# Patient Record
Sex: Female | Born: 1980 | Race: Black or African American | Hispanic: No | Marital: Married | Smoking: Former smoker
Health system: Southern US, Community
[De-identification: ages and names within clinical notes are randomized; demographics above are authoritative.]

## PROBLEM LIST (undated history)

## (undated) DIAGNOSIS — D860 Sarcoidosis of lung: Secondary | ICD-10-CM

## (undated) DIAGNOSIS — N200 Calculus of kidney: Secondary | ICD-10-CM

## (undated) DIAGNOSIS — D75A Glucose-6-phosphate dehydrogenase (G6PD) deficiency without anemia: Secondary | ICD-10-CM

## (undated) HISTORY — DX: Sarcoidosis of lung: D86.0

## (undated) HISTORY — DX: Glucose-6-phosphate dehydrogenase (G6PD) deficiency without anemia: D75.A

## (undated) HISTORY — PX: OTHER SURGICAL HISTORY: SHX169

---

## 2009-07-18 ENCOUNTER — Emergency Department (HOSPITAL_COMMUNITY): Admission: EM | Admit: 2009-07-18 | Discharge: 2009-07-18 | Payer: Self-pay | Admitting: Emergency Medicine

## 2009-09-06 ENCOUNTER — Ambulatory Visit: Payer: Self-pay | Admitting: Internal Medicine

## 2009-09-06 DIAGNOSIS — D869 Sarcoidosis, unspecified: Secondary | ICD-10-CM

## 2009-09-06 DIAGNOSIS — D551 Anemia due to other disorders of glutathione metabolism: Secondary | ICD-10-CM | POA: Insufficient documentation

## 2009-09-13 LAB — CONVERTED CEMR LAB
Alkaline Phosphatase: 63 units/L (ref 39–117)
Angiotensin 1 Converting Enzyme: 100 units/L — ABNORMAL HIGH (ref 9–67)
BUN: 18 mg/dL (ref 6–23)
Bilirubin, Direct: 0.4 mg/dL — ABNORMAL HIGH (ref 0.0–0.3)
CO2: 26 meq/L (ref 19–32)
Calcium: 9.9 mg/dL (ref 8.4–10.5)
Chloride: 105 meq/L (ref 96–112)
Glucose, Bld: 81 mg/dL (ref 70–99)
Sodium: 139 meq/L (ref 135–145)
Total Bilirubin: 1.6 mg/dL — ABNORMAL HIGH (ref 0.3–1.2)

## 2009-10-16 ENCOUNTER — Ambulatory Visit: Payer: Self-pay | Admitting: Internal Medicine

## 2009-10-19 ENCOUNTER — Encounter: Payer: Self-pay | Admitting: Internal Medicine

## 2009-10-23 ENCOUNTER — Telehealth (INDEPENDENT_AMBULATORY_CARE_PROVIDER_SITE_OTHER): Payer: Self-pay | Admitting: *Deleted

## 2010-04-17 NOTE — Progress Notes (Signed)
Summary: CXR results  Phone Note Call from Patient Call back at [phone]   Caller: Patient Call For: wert Summary of Call: Returning phone call in ref to letter for test results. Initial call taken by: Darletta Moll,  October 23, 2009 9:54 AM  Follow-up for Phone Call        Patient is aware cxr from 8/1 was normal and no changes to any recs from [facility]. Follow-up by: Michel Bickers CMA,  October 23, 2009 10:58 AM

## 2010-04-17 NOTE — Letter (Signed)
Summary: [facility]  [address]   [place], [place] [address]   Phone: [phone]  Fax: [phone]    10/19/2009  [facility] [address] [place], [place]  [address]  Dear Ms. Robers,    We have tried to reach you by telephone regarding your recent test results, but were unable to speak with you.  Please call our office at [phone] for these results, thank you.       Sincerely,   [facility]

## 2010-04-17 NOTE — Assessment & Plan Note (Signed)
Summary: Pulmonary/ new pt eval for sarcoidosis    Visit Type:  Initial Consult Copy to:  Self Primary Provider/Referring Provider:  none  CC:  Sarcoidosis.  History of Present Illness: 30 yobf quit smoking 10/2008 with dx of sarcoid at [facility] aug 2009 with weakness and wt loss sob and cough and dx made by tbbx and placed on prednisone daily since then with >  80% resolution of symptoms.  September 06, 2009 1st pulm ov c/o aches and pains with doe x steps on prednisone 10mg  and can do one flight without stopping. Now off prednisone x sev months with progressively worsening indolent onset constant  Low Back and thighs  not wrists, not ankles, no rash assoc with mild dry cough and decrease activity tol due to sob.   Never saw eye doctor. Much worse off prednisone to point where needs to 5 advil at bedtime.  Pt denies any significant sore throat, dysphagia, itching, sneezing,  nasal congestion or excess secretions,  fever, chills, sweats, unintended wt loss, pleuritic or exertional cp, hempoptysis, orthopnea pnd or leg swelling   Current Medications (verified): 1)  None  Allergies (verified): 1)  ! 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 Quit in Aug 2010.  Smoked for approx 10 yrs up to 1/2 ppd Moved from [place] to [facility] in April 2011  Review of Systems       The patient complains of shortness of breath with activity, non-productive cough, and joint stiffness or pain.  The patient denies shortness of breath at rest, productive cough, coughing up blood, chest pain, irregular heartbeats, acid heartburn, indigestion, loss of appetite, weight change, abdominal  pain, difficulty swallowing, sore throat, tooth/dental problems, headaches, nasal congestion/difficulty breathing through nose, sneezing, itching, ear ache, anxiety, depression, hand/feet swelling, rash, change in color of mucus, and fever.    Vital Signs:  Patient profile:   30 year old female Height:      64 inches Weight:      170 pounds BMI:     29.29 O2 Sat:      96 % on Room air Temp:     97.6 degrees F oral Pulse rate:   76 / minute BP sitting:   108 / 72  (left arm)  Vitals Entered By: Vernie Murders (September 06, 2009 2:02 PM)  O2 Flow:  Room air  Physical Exam  Additional Exam:  amb pleasant bf nad wt = 170 September 06, 2009  HEENT: nl dentition, turbinates, and orophanx. Nl external ear canals without cough reflex NECK :  without JVD/Nodes/TM/ nl carotid upstrokes bilaterally LUNGS: no acc muscle use, clear to A and P bilaterally without cough on insp or exp maneuvers CV:  RRR  no s3 or murmur or increase in P2, no edema  ABD:  soft and nontender with nl excursion in the supine position. No bruits or organomegaly, bowel sounds nl MS:  warm without deformities, calf tenderness, cyanosis or clubbing SKIN: warm and dry without lesions   NEURO:  alert, approp, no deficits     Sodium  139 mEq/L                   135-145   Potassium                 4.8 mEq/L                   3.5-5.1   Chloride                  105 mEq/L                   96-112   Carbon Dioxide            26 mEq/L                    19-32   Glucose                   81 mg/dL                    29-52   BUN                       18 mg/dL                    8-41   Creatinine                1.1 mg/dL                   3.2-4.4   Calcium                   9.9 mg/dL                   0.1-02.7   GFR                       75.51 mL/min                >60  Tests: (2) Hepatic/Liver Function Panel (HEPATIC)   Total Bilirubin      [H]  1.6 mg/dL                   2.5-3.6   Direct Bilirubin     [H]  0.4 mg/dL                    6.4-4.0   Alkaline Phosphatase      63 U/L                      39-117   AST                       29 U/L                      0-37   ALT                       15 U/L                      0-35   Total Protein        [H]  8.7 g/dL                    3.4-7.4   Albumin  3.9 g/dL                    1.6-1.0  Tests: (3) Sed Rate (ESR)   Sed Rate                  22 mm/hr                    0-22    Impression & Recommendations:  Problem # 1:  PULMONARY SARCOIDOSIS (ICD-135) Labs ok x for increaseed protein to alb ratio typical of active sarcoid with mostly musculoskeletal co's disproportionate to pulmonary symptoms typical of her patter since onset.  No xrays in e chart  The goal with a chronic steroid dependent illness is always arriving at the lowest effective dose that controls the disease/symptoms and not accepting a set "formula" which is based on statistics that don't take into accound individual variability or the natural hx of the dz in every individual patient, which may well vary over time. For now try ceiling of 20 and floor of 10 then regroup in 6 weeks with pfts and cxr then  Medications Added to Medication List This Visit: 1)  Prednisone 10 Mg Tabs (Prednisone) .... Take 2 daily with bfast until back to normal then 1 daily thereafter  Other Orders: T-Angiotensin i-Converting Enzyme (96045-40981) TLB-BMP (Basic Metabolic Panel-BMET) (80048-METABOL) TLB-Hepatic/Liver Function Pnl (80076-HEPATIC) TLB-Sedimentation Rate (ESR) (85652-ESR) New Patient Level V (19147)  Patient Instructions: 1)  Please schedule a follow-up appointment in 6 weeks, sooner if needed with PFT's and cxr on return 2)  Ok to use advil up to 4 with meal as needed for joint pain 3)  Prednisone 10 mg 2 daily until btter then one daily until return 4)  You need an eye evaluation Chales Abrahams) Prescriptions: PREDNISONE 10 MG  TABS (PREDNISONE) Take 2 daily with bfast until back to  normal then 1 daily thereafter  #100 x 0   Entered and Authorized by:   Nyoka Cowden MD   Signed by:   Nyoka Cowden MD on 09/06/2009   Method used:   Electronically to        [facility] [phone]* (retail)       [address] [place], [place]  [address]       Ph: [phone] or [phone]       Fax: [phone]   RxID:   [id number]

## 2010-04-17 NOTE — Miscellaneous (Signed)
Summary: Orders Update pft charges  Clinical Lists Changes  Orders: Added new Service order of Carbon Monoxide diffusing w/capacity (94720) - Signed Added new Service order of Lung Volumes (94240) - Signed Added new Service order of Spirometry (Pre & Post) (94060) - Signed 

## 2010-04-17 NOTE — Assessment & Plan Note (Signed)
Summary: Pulmonary/  f/u ov   Copy to:  Self Primary Provider/Referring Provider:  none  CC:  Followup with PFT's.  Pt states doing well and denies any complaints today.Marland Kitchen  History of Present Illness: 4 yobf quit smoking 10/2008 with dx of sarcoid at [facility] aug 2009 with weakness and wt loss sob and cough and dx made by tbbx and placed on prednisone daily since then with >  80% resolution of symptoms.  September 06, 2009 1st pulm ov c/o aches and pains with doe x steps on prednisone 10mg  and can do one flight without stopping. Now off prednisone x sev months with progressively worsening indolent onset constant  Low Back and thighs  not wrists, not ankles, no rash assoc with mild dry cough and decrease activity tol due to sob.   Never saw eye doctor. Much worse off prednisone to point where needs to 5 advil at bedtime.  rec Ok to use advil up to 4 with meal as needed for joint pain Prednisone 10 mg 2 daily until btter then one daily until return Need an eye evaluation> done.  October 16, 2009 Followup with PFT's.  Pt states doing well and denies any complaints. no more aches or pains.  Pt denies any significant sore throat, dysphagia, itching, sneezing,  nasal congestion or excess secretions,  fever, chills, sweats, unintended wt loss, pleuritic or exertional cp, hempoptysis, change in activity tolerance  orthopnea pnd or leg swelling Pt also denies any obvious fluctuation in symptoms with weather or environmental change or other alleviating or aggravating factors.       Current Medications (verified): 1)  Prednisone 10 Mg  Tabs (Prednisone) .Marland Kitchen.. 1  Every Morning  Allergies (verified): 1)  ! 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)       - Dx August 2009 [facility]       - ACE level September 07, 2009 = 100       - Opth eval rec September 07, 2009 >  [facility]       - PFT's October 16, 2009 VC 63% no airflow obstruction, DLCO  54 > corrects to 109  Vital Signs:  Patient profile:   30 year old female Weight:      178 pounds O2 Sat:      98 % on Room air Temp:     97.5 degrees F oral Pulse rate:   78 / minute BP sitting:   100 / 62  (left arm)  Vitals Entered By: Vernie Murders (October 16, 2009 10:49 AM)  O2 Flow:  Room air  Physical Exam  Additional Exam:  amb pleasant bf nad  wt = 170 September 06, 2009 > 178 October 16, 2009  HEENT: nl dentition, turbinates, and orophanx. Nl external ear canals without cough reflex NECK :  without JVD/Nodes/TM/ nl carotid upstrokes bilaterally LUNGS: no acc muscle use, clear to A and P bilaterally without cough on insp or exp maneuvers CV:  RRR  no s3 or murmur or increase in P2, no edema  ABD:  soft and nontender with nl excursion in the supine position. No bruits or organomegaly, bowel sounds nl MS:  warm without deformities, calf tenderness, cyanosis or clubbing       CXR  Procedure date:  10/16/2009  Findings:        Comparison: None.   Findings: The heart size is normal.  There is bilateral superior hilar retraction  with associated perihilar scarring and probable bronchiectasis.  Prominent left paratracheal density may be related to scarring adjacent to the aortic arch or adenopathy.  No edema or confluent air space opacity is demonstrated.  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   IMPRESSION:   1.  Bilateral hilar distortion and peribronchial scarring consistent with sarcoidosis. 2.  Prominent left paratracheal density could be related to parenchymal scarring or adenopathy.  Correlation with prior studies is recommended.  In the absence of prior studies, short-term radiographic follow-up or CT would be suggested.  Impression & Recommendations:  Problem # 1:  PULMONARY SARCOIDOSIS (ICD-135) The goal with a chronic steroid dependent illness is always arriving at the lowest effective dose that controls the disease/symptoms and not accepting a set "formula" which  is based on statistics that don't take into accound individual variability or the natural hx of the dz in every individual patient, which may well vary over time. For now try ceiling of 20 and floor of 5 mg daily  Natural hx, etilogy reviewed. . See instructions for specific recommendations   Medications Added to Medication List This Visit: 1)  Prednisone 10 Mg Tabs (Prednisone) .Marland Kitchen.. 1  every morning 2)  Prednisone 10 Mg Tabs (Prednisone) .... 2 until better taper to one half daily  Other Orders: T-2 View CXR (71020TC) Est. Patient Level III (81191)  Patient Instructions: 1)  Ceiling for prednisone is 20 mg per day if you get worse, but as long as you feel better ok to a floor of 5 mg per day 2)  Sarcoidosis is a benign inflammatory condition caused by your immune system being too revved up like a thermostat on your furnace that's partially  stuck causing arthitis, rash, short of breath and cough and vision issues.  3)  Return to office in 3 months, sooner if needed  Prescriptions: PREDNISONE 10 MG  TABS (PREDNISONE) 2 until better taper to one half daily  #100 x 0   Entered and Authorized by:   Nyoka Cowden MD   Signed by:   Nyoka Cowden MD on 10/16/2009   Method used:   Electronically to        [facility] [phone]* (retail)       [address] [place], [place]  [address]       Ph: [phone] or [phone]       Fax: [phone]   RxID:   [id number]

## 2010-08-07 ENCOUNTER — Other Ambulatory Visit: Payer: Self-pay | Admitting: Internal Medicine

## 2010-12-07 ENCOUNTER — Other Ambulatory Visit: Payer: Self-pay | Admitting: Internal Medicine

## 2011-01-29 ENCOUNTER — Other Ambulatory Visit: Payer: Self-pay | Admitting: Internal Medicine

## 2011-01-29 NOTE — Telephone Encounter (Signed)
We already refilled this for her in May with instructions will need ov for refills. I have tried calling her to sched appt and her line has been d/c'ed. Rx denied with instructions to have pharmacist advise the pt to call for an appt.

## 2011-02-21 ENCOUNTER — Encounter: Payer: Self-pay | Admitting: Internal Medicine

## 2011-02-22 ENCOUNTER — Ambulatory Visit: Payer: Self-pay | Admitting: Internal Medicine

## 2011-02-28 ENCOUNTER — Encounter (HOSPITAL_COMMUNITY): Payer: Self-pay | Admitting: *Deleted

## 2011-02-28 ENCOUNTER — Emergency Department (HOSPITAL_COMMUNITY)
Admission: EM | Admit: 2011-02-28 | Discharge: 2011-02-28 | Disposition: A | Discharge status: Home / Self Care | Payer: Self-pay | Attending: Emergency Medicine | Admitting: Emergency Medicine

## 2011-02-28 DIAGNOSIS — M791 Myalgia, unspecified site: Secondary | ICD-10-CM

## 2011-02-28 DIAGNOSIS — M79609 Pain in unspecified limb: Secondary | ICD-10-CM | POA: Insufficient documentation

## 2011-02-28 DIAGNOSIS — IMO0001 Reserved for inherently not codable concepts without codable children: Secondary | ICD-10-CM | POA: Insufficient documentation

## 2011-02-28 DIAGNOSIS — M545 Low back pain, unspecified: Secondary | ICD-10-CM | POA: Insufficient documentation

## 2011-02-28 DIAGNOSIS — D869 Sarcoidosis, unspecified: Secondary | ICD-10-CM | POA: Insufficient documentation

## 2011-02-28 DIAGNOSIS — R5381 Other malaise: Secondary | ICD-10-CM | POA: Insufficient documentation

## 2011-02-28 DIAGNOSIS — R509 Fever, unspecified: Secondary | ICD-10-CM | POA: Insufficient documentation

## 2011-02-28 MED ORDER — PREDNISONE 10 MG PO TABS: 10.0000 mg | ORAL_TABLET | Freq: Every day | ORAL | Status: DC

## 2011-02-28 NOTE — ED Notes (Signed)
Pt says that she has sarcoidosis and has not taken her meds (prednisone) in a month d/t not having a physician and has been having pain through back and legs since she has stop taking them.  Also c/o fever that started on Sunday (has been taking ibuprofen)

## 2011-02-28 NOTE — ED Provider Notes (Signed)
History     CSN: [id number] Arrival date & time: 02/28/2011  8:33 AM   First MD Initiated Contact with Patient 02/28/11 0902      Chief Complaint  Patient presents with  . Back Pain    (Consider location/radiation/quality/duration/timing/severity/associated sxs/prior treatment) HPI History provided by pt.   Pt was diagnose w/ sarcoidosis in 2009 and has been on daily prednisone ever since.  She is unable to see her pulmonologist anymore because uninsured and prednisone ran out 1 month ago.  She has had gradually worsening fatigue, generalized weakness and myalgias.  Pain most prominent in low back and diffuse, bilateral thighs.  Has also had intermittent fever for the past 5 days.  Denies cough, SOB, N/V/D, abd pain, urinary sx.  All sx similar to what she experienced at time of diagnosis w/ sarcoidosis.    Past Medical History  Diagnosis Date  . Pulmonary sarcoidosis   . Glucose-6-phosphate dehydrogenase deficiency     Past Surgical History  Procedure Date  . Cystine stone removal     Family History  Problem Relation Age of Onset  . Sarcoidosis      cousin    History  Substance Use Topics  . Smoking status: Former Smoker -- 0.5 packs/day for 10 years    Types: Cigarettes    Quit date: 10/16/2008  . Smokeless tobacco: Not on file  . Alcohol Use: No    OB History    Grav Para Term Preterm Abortions TAB SAB Ect Mult Living                  Review of Systems  All other systems reviewed and are negative.    Allergies  Sulfonamide derivatives  Home Medications   Current Outpatient Rx  Name Route Sig Dispense Refill  . IBUPROFEN 200 MG PO TABS Oral Take 800 mg by mouth 3 (three) times daily as needed. For pain     . PREDNISONE 10 MG PO TABS Oral Take 1 tablet (10 mg total) by mouth daily. 30 tablet 0    BP 110/80  Pulse 98  Temp(Src) 96.9 F (36.1 C) (Oral)  Resp 17  SpO2 100%  LMP 02/12/2011  Physical Exam  Nursing note and vitals  reviewed. Constitutional: She is oriented to person, place, and time. She appears well-developed and well-nourished. No distress.  HENT:  Head: Normocephalic and atraumatic.  Eyes:       Normal appearance  Neck: Normal range of motion.  Cardiovascular: Normal rate and regular rhythm.   Pulmonary/Chest: Effort normal and breath sounds normal.  Musculoskeletal: Normal range of motion.       Mild tenderness proximal thighs.  Entire back non-tender.  NV LE extremities intact.   Neurological: She is alert and oriented to person, place, and time.  Skin: Skin is warm and dry. No rash noted.  Psychiatric: She has a normal mood and affect. Her behavior is normal.    ED Course  Procedures (including critical care time)  Labs Reviewed - No data to display No results found.   1. Sarcoidosis   2. Myalgia       MDM  Pt has h/o sarcoidosis and has been off of her prednisone for 1 month (was prescribed by pulmonologist but now uninsured and can no longer see him).  Has had gradually worsening fatigue, weakness and myalgias.  Pt prescribed her normal dose of prednisone and referred to [facility].  Return precautions discussed.  9:39 AM  Otilio Miu, [place] 02/28/11 [phone]

## 2011-02-28 NOTE — ED Provider Notes (Signed)
Medical screening examination/treatment/procedure(s) were performed by non-physician practitioner and as supervising physician I was immediately available for consultation/collaboration.   Gwyneth Sprout, MD 02/28/11 [phone]

## 2011-03-04 ENCOUNTER — Encounter: Payer: Self-pay | Admitting: Internal Medicine

## 2011-03-04 NOTE — Progress Notes (Signed)
Subjective:     Patient ID: Kara Atkinson, female   DOB: 1980/08/26, 29 y.o.   MRN: [id number]  HPI  57 yobf quit smoking 10/2008 with dx of sarcoid at [facility] aug 2009 with weakness and wt loss sob and cough and dx made by tbbx and placed on prednisone daily since then with > 80% resolution of symptoms.  September 06, 2009 1st pulm ov c/o aches and pains with doe x steps on prednisone 10mg  and can do one flight without stopping. Now off prednisone x sev months with progressively worsening indolent onset constant Low Back and thighs not wrists, not ankles, no rash assoc with mild dry cough and decrease activity tol due to sob. Never saw eye doctor.  Much worse off prednisone to point where needs to 5 advil at bedtime. rec Ok to use advil up to 4 with meal as needed for joint pain  Prednisone 10 mg 2 daily until btter then one daily until return  Need an eye evaluation> done.  October 16, 2009 Followup with PFT's. Pt states doing well and denies any complaints. no more aches or pains.   rec Ceiling for prednisone is 20 mg per day if you get worse, but as long as you feel better ok to a floor of 5 mg per day   03/04/2011 f/u ov/Kara Atkinson cc       Allergies   1) ! 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 Nl external ear canals without cough reflex  NECK : without JVD/Nodes/TM/ nl carotid upstrokes bilaterally  LUNGS: no acc muscle use, clear to A and P bilaterally without cough on insp or exp maneuvers  CV: RRR no s3 or murmur or increase in P2, no  edema  ABD: soft and nontender with nl excursion in the supine position. No bruits or organomegaly, bowel sounds nl  MS: warm without deformities, calf tenderness, cyanosis or clubbing  Assessment:     ***    Plan:     ***

## 2011-05-16 ENCOUNTER — Emergency Department (HOSPITAL_COMMUNITY): Payer: Self-pay

## 2011-05-16 ENCOUNTER — Encounter (HOSPITAL_COMMUNITY): Payer: Self-pay | Admitting: Emergency Medicine

## 2011-05-16 ENCOUNTER — Emergency Department (HOSPITAL_COMMUNITY)
Admission: EM | Admit: 2011-05-16 | Discharge: 2011-05-16 | Disposition: A | Discharge status: Home / Self Care | Payer: Self-pay | Attending: Emergency Medicine | Admitting: Emergency Medicine

## 2011-05-16 DIAGNOSIS — T148XXA Other injury of unspecified body region, initial encounter: Secondary | ICD-10-CM

## 2011-05-16 DIAGNOSIS — IMO0002 Reserved for concepts with insufficient information to code with codable children: Secondary | ICD-10-CM | POA: Insufficient documentation

## 2011-05-16 DIAGNOSIS — D551 Anemia due to other disorders of glutathione metabolism: Secondary | ICD-10-CM | POA: Insufficient documentation

## 2011-05-16 DIAGNOSIS — M549 Dorsalgia, unspecified: Secondary | ICD-10-CM | POA: Insufficient documentation

## 2011-05-16 DIAGNOSIS — Z87442 Personal history of urinary calculi: Secondary | ICD-10-CM | POA: Insufficient documentation

## 2011-05-16 DIAGNOSIS — D869 Sarcoidosis, unspecified: Secondary | ICD-10-CM | POA: Insufficient documentation

## 2011-05-16 DIAGNOSIS — X58XXXA Exposure to other specified factors, initial encounter: Secondary | ICD-10-CM | POA: Insufficient documentation

## 2011-05-16 DIAGNOSIS — J99 Respiratory disorders in diseases classified elsewhere: Secondary | ICD-10-CM | POA: Insufficient documentation

## 2011-05-16 HISTORY — DX: Calculus of kidney: N20.0

## 2011-05-16 LAB — POCT PREGNANCY, URINE: Preg Test, Ur: NEGATIVE

## 2011-05-16 LAB — CBC
HCT: 37.5 % (ref 36.0–46.0)
Hemoglobin: 12.2 g/dL (ref 12.0–15.0)
MCH: 22.8 pg — ABNORMAL LOW (ref 26.0–34.0)
MCHC: 32.5 g/dL (ref 30.0–36.0)
MCV: 70.1 fL — ABNORMAL LOW (ref 78.0–100.0)
RDW: 15.6 % — ABNORMAL HIGH (ref 11.5–15.5)

## 2011-05-16 LAB — COMPREHENSIVE METABOLIC PANEL
Albumin: 3.4 g/dL — ABNORMAL LOW (ref 3.5–5.2)
Alkaline Phosphatase: 78 U/L (ref 39–117)
BUN: 13 mg/dL (ref 6–23)
Calcium: 9.9 mg/dL (ref 8.4–10.5)
Creatinine, Ser: 1.06 mg/dL (ref 0.50–1.10)
GFR calc Af Amer: 81 mL/min — ABNORMAL LOW (ref >90)
Glucose, Bld: 89 mg/dL (ref 70–99)
Total Protein: 8.9 g/dL — ABNORMAL HIGH (ref 6.0–8.3)

## 2011-05-16 LAB — URINALYSIS, ROUTINE W REFLEX MICROSCOPIC
Glucose, UA: NEGATIVE mg/dL
Hgb urine dipstick: NEGATIVE
Ketones, ur: NEGATIVE mg/dL
Protein, ur: NEGATIVE mg/dL
Urobilinogen, UA: 0.2 mg/dL (ref 0.0–1.0)

## 2011-05-16 LAB — LIPASE, BLOOD: Lipase: 35 U/L (ref 11–59)

## 2011-05-16 MED ORDER — OXYCODONE-ACETAMINOPHEN 5-325 MG PO TABS: 1.0000 | ORAL_TABLET | Freq: Four times a day (QID) | ORAL | Status: AC | PRN

## 2011-05-16 MED ORDER — OXYCODONE-ACETAMINOPHEN 5-325 MG PO TABS
2.0000 | ORAL_TABLET | Freq: Once | ORAL | Status: DC
  Filled 2011-05-16: qty 2

## 2011-05-16 MED ORDER — CYCLOBENZAPRINE HCL 10 MG PO TABS: 10.0000 mg | ORAL_TABLET | Freq: Two times a day (BID) | ORAL | Status: AC | PRN

## 2011-05-16 MED ORDER — CYCLOBENZAPRINE HCL 10 MG PO TABS
5.0000 mg | ORAL_TABLET | Freq: Once | ORAL | Status: AC
  Administered 2011-05-16: 10 mg via ORAL
  Filled 2011-05-16: qty 1

## 2011-05-16 NOTE — ED Provider Notes (Signed)
History     CSN: [id number]  Arrival date & time 05/16/11  0921   First MD Initiated Contact with Patient 05/16/11 [phone]      Chief Complaint  Patient presents with  . Back Pain    (Consider location/radiation/quality/duration/timing/severity/associated sxs/prior treatment) HPI  Pt presents to the ED with complaints of back pain for 3 weeks, the pains are progressively getting worse. She denies injury to her back. The patient states that she has a history of kidney stones, which she states are very large. She states that this pain feels the same and she is concerned about if she is trying to pass a stone. Her symptoms are least aggravating when she lays down. Her pain is worse with certain movements. She denies fevers, urinary symptoms or hx of back problems.  Past Medical History  Diagnosis Date  . Pulmonary sarcoidosis   . Glucose-6-phosphate dehydrogenase deficiency   . Kidney stone     Past Surgical History  Procedure Date  . Cystine stone removal     Family History  Problem Relation Age of Onset  . Sarcoidosis      cousin    History  Substance Use Topics  . Smoking status: Former Smoker -- 0.5 packs/day for 10 years    Types: Cigarettes    Quit date: 10/16/2008  . Smokeless tobacco: Not on file  . Alcohol Use: No    OB History    Grav Para Term Preterm Abortions TAB SAB Ect Mult Living                  Review of Systems  All other systems reviewed and are negative.    Allergies  Sulfonamide derivatives  Home Medications   Current Outpatient Rx  Name Route Sig Dispense Refill  . IBUPROFEN 200 MG PO TABS Oral Take 800 mg by mouth 3 (three) times daily as needed. For pain     . PREDNISONE 10 MG PO TABS Oral Take 10 mg by mouth daily.      BP 109/68  Pulse 108  Temp(Src) 97.9 F (36.6 C) (Oral)  Resp 18  SpO2 99%  LMP 03/20/2011  Physical Exam  Nursing note and vitals reviewed. Constitutional: She appears well-developed and well-nourished.  No distress.  HENT:  Head: Normocephalic and atraumatic.  Eyes: Pupils are equal, round, and reactive to light.  Neck: Normal range of motion. Neck supple.  Cardiovascular: Normal rate and regular rhythm.   Pulmonary/Chest: Effort normal.  Abdominal: Soft.  Musculoskeletal:       Thoracic back: She exhibits pain. She exhibits normal range of motion, no bony tenderness, no swelling, no edema, no deformity, no laceration, no spasm and normal pulse. Tenderness: no tenderness to palpation, pt describes pain as being inside.  Neurological: She is alert.  Skin: Skin is warm and dry.    ED Course  Procedures (including critical care time)  Labs Reviewed  CBC - Abnormal; Notable for the following:    WBC 3.1 (*)    RBC 5.35 (*)    MCV 70.1 (*)    MCH 22.8 (*)    RDW 15.6 (*)    All other components within normal limits  COMPREHENSIVE METABOLIC PANEL - Abnormal; Notable for the following:    Sodium 134 (*)    Total Protein 8.9 (*)    Albumin 3.4 (*)    GFR calc non Af Amer 70 (*)    GFR calc Af Amer 81 (*)    All other  components within normal limits  URINALYSIS, ROUTINE W REFLEX MICROSCOPIC  POCT PREGNANCY, URINE  LIPASE, BLOOD  PREGNANCY, URINE   Ct Abdomen Pelvis Wo Contrast  05/16/2011  *RADIOLOGY REPORT*  Clinical Data: Right upper flank pain.  History urinary tract stones. History sarcoidosis.  CT ABDOMEN AND PELVIS WITHOUT CONTRAST  Technique:  Multidetector CT imaging of the abdomen and pelvis was performed following the standard protocol without intravenous contrast.  Comparison: None.  Findings: There is partial visualization of small foci of airspace opacity in the lower lobes bilaterally and in the right middle lobe.  No pleural or pericardial effusion is identified.  There is a nonobstructing stone in the lower pole of the right kidney measuring 1 cm in diameter.  An additional nonobstructing stone is identified in the mid pole of the right kidney.  There are no ureteral  stones and no hydronephrosis.  The left kidney is unremarkable.  The spleen, liver, gallbladder, adrenal glands and pancreas appear normal.  There are a few small retroperitoneal lymph nodes but no pathologic lymphadenopathy by CT size criteria is identified. Uterus, adnexa and urinary bladder are unremarkable.  Small amount of free pelvic fluid is consistent with physiologic change.  A small fat containing umbilical hernia is identified.  The stomach, small and large bowel and appendix appear normal.  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  IMPRESSION:  1.  Nonobstructing right renal stones as described above.  No hydronephrosis or ureteral stone. 2.  Small foci of airspace disease in the lower lobes and right middle lobe may be related the history sarcoidosis. Finding is incompletely visualized. 3.  Bilateral symmetric sacroiliitis is nonspecific and can be seen in a variety processes including inflammatory bowel disease, ankylosing spondylitis or hypoparathyroidism.  Please note that there is no evidence of inflammatory bowel disease on this examination.  Original Report Authenticated By: Bernadene Bell. D'ALESSIO, M.D.     1. Muscle strain       MDM  Pts CT scan shows no stones or active inflammatory bowel disease. The patients symptoms are consistent with muscle strain. Will treat patient accordingly with warm muscle compresses, muscle relaxers and pain medication. Will give patient referral to [facility]. Pt has G6PD deficiency and sarcoidosis. Pts labs are stable when compared to labs from 1 year ago.  Will treat patients pain and have her follow-up with PCP.       Dorthula Matas, PA 05/16/11 1214

## 2011-05-16 NOTE — ED Provider Notes (Signed)
Medical screening examination/treatment/procedure(s) were performed by non-physician practitioner and as supervising physician I was immediately available for consultation/collaboration.   Glynn Octave, MD 05/16/11 2131

## 2011-05-16 NOTE — ED Notes (Signed)
Back pain x 3 weeks no injury she staes

## 2011-05-16 NOTE — Discharge Instructions (Signed)
Sprains °Sprains are painful injuries to joints as a result of partial or complete tearing of ligaments. °HOME CARE INSTRUCTIONS  °· For the first 24 hours, keep the injured limb raised on 2 pillows while lying down.  °· Apply ice bags about every 2 hours for 20 to 30 minutes, while awake, to the injured area for the first 24 hours. Then apply as directed by your caregiver. Place the ice in a plastic bag with a towel around it to prevent frostbite to the skin.  °· Only take over-the-counter or prescription medicines for pain, discomfort, or fever as directed by your caregiver.  °· If an ace bandage (a stretchy, elastic wrapping bandage) has been applied today, remove and reapply every 3 to 4 hours. Apply firm enough to keep swelling down. Donot apply tightly. Watch fingers or toes for swelling, bluish discoloration, coldness, numbness, or excessive pain. If any of these problems (symptoms) occur, remove the ace bandage and reapply it more loosely. Contact your caregiver or return to this location if these symptoms persist.  °Persistent pain and inability to use the injured area for more than 2 to 3 days are warning signs. See a caregiver for a follow-up visit as soon as possible. A hairline fracture (broken bone) may not show on X-rays. Persistent pain and swelling indicate that further evaluation, use of crutches, and/or more X-rays are needed. X-rays may sometimes not show a small fracture until a week or ten days later. Make a follow-up appointment with your own caregiver or to whom we have referred you. A specialist in reading X-rays(radiologist) will re-read your X-rays. Make sure you know how to obtain your X-ray results. Do not assume everything is normal if you do not hear from us. °SEEK IMMEDIATE MEDICAL CARE IF: °· You develop severe pain or more swelling.  °· The pain is not controlled with medicine.  °· Your skin or nails below the injury turn blue or grey or feel cold or numb.  °Document Released:  03/01/2000 Document Revised: 11/14/2010 Document Reviewed: 10/19/2007 °[employer]® Patient Information ©2012 [employer]. °

## 2011-05-21 ENCOUNTER — Other Ambulatory Visit: Payer: Self-pay | Admitting: Internal Medicine

## 2012-04-26 ENCOUNTER — Encounter (HOSPITAL_COMMUNITY): Payer: Self-pay | Admitting: *Deleted

## 2012-04-26 ENCOUNTER — Emergency Department (HOSPITAL_COMMUNITY)
Admission: EM | Admit: 2012-04-26 | Discharge: 2012-04-26 | Disposition: A | Discharge status: Home / Self Care | Payer: Self-pay | Attending: Emergency Medicine | Admitting: Emergency Medicine

## 2012-04-26 DIAGNOSIS — D869 Sarcoidosis, unspecified: Secondary | ICD-10-CM | POA: Insufficient documentation

## 2012-04-26 DIAGNOSIS — Z87442 Personal history of urinary calculi: Secondary | ICD-10-CM | POA: Insufficient documentation

## 2012-04-26 DIAGNOSIS — D551 Anemia due to other disorders of glutathione metabolism: Secondary | ICD-10-CM | POA: Insufficient documentation

## 2012-04-26 DIAGNOSIS — D649 Anemia, unspecified: Secondary | ICD-10-CM | POA: Insufficient documentation

## 2012-04-26 DIAGNOSIS — Z87891 Personal history of nicotine dependence: Secondary | ICD-10-CM | POA: Insufficient documentation

## 2012-04-26 DIAGNOSIS — N289 Disorder of kidney and ureter, unspecified: Secondary | ICD-10-CM | POA: Insufficient documentation

## 2012-04-26 DIAGNOSIS — J99 Respiratory disorders in diseases classified elsewhere: Secondary | ICD-10-CM | POA: Insufficient documentation

## 2012-04-26 DIAGNOSIS — Z3202 Encounter for pregnancy test, result negative: Secondary | ICD-10-CM | POA: Insufficient documentation

## 2012-04-26 LAB — URINALYSIS, ROUTINE W REFLEX MICROSCOPIC
Bilirubin Urine: NEGATIVE
Nitrite: NEGATIVE
Specific Gravity, Urine: 1.021 (ref 1.005–1.030)
Urobilinogen, UA: 0.2 mg/dL (ref 0.0–1.0)
pH: 6.5 (ref 5.0–8.0)

## 2012-04-26 LAB — COMPREHENSIVE METABOLIC PANEL
ALT: 14 U/L (ref 0–35)
AST: 24 U/L (ref 0–37)
Albumin: 3.1 g/dL — ABNORMAL LOW (ref 3.5–5.2)
Calcium: 10 mg/dL (ref 8.4–10.5)
Creatinine, Ser: 1.31 mg/dL — ABNORMAL HIGH (ref 0.50–1.10)
Sodium: 130 mEq/L — ABNORMAL LOW (ref 135–145)

## 2012-04-26 LAB — CBC WITH DIFFERENTIAL/PLATELET
Basophils Absolute: 0 10*3/uL (ref 0.0–0.1)
Lymphs Abs: 1 10*3/uL (ref 0.7–4.0)
MCH: 22.2 pg — ABNORMAL LOW (ref 26.0–34.0)
MCV: 68 fL — ABNORMAL LOW (ref 78.0–100.0)
Monocytes Absolute: 0.6 10*3/uL (ref 0.1–1.0)
Monocytes Relative: 12 % (ref 3–12)
Neutrophils Relative %: 67 % (ref 43–77)
Platelets: 279 10*3/uL (ref 150–400)
RBC: 4.87 MIL/uL (ref 3.87–5.11)
RDW: 15.5 % (ref 11.5–15.5)
WBC: 5.4 10*3/uL (ref 4.0–10.5)

## 2012-04-26 LAB — URINE MICROSCOPIC-ADD ON

## 2012-04-26 MED ORDER — HYDROMORPHONE HCL PF 1 MG/ML IJ SOLN
1.0000 mg | Freq: Once | INTRAMUSCULAR | Status: AC
  Administered 2012-04-26: 1 mg via INTRAVENOUS
  Filled 2012-04-26: qty 1

## 2012-04-26 MED ORDER — ONDANSETRON HCL 4 MG/2ML IJ SOLN
4.0000 mg | Freq: Once | INTRAMUSCULAR | Status: AC
  Administered 2012-04-26: 4 mg via INTRAVENOUS
  Filled 2012-04-26: qty 2

## 2012-04-26 MED ORDER — HYDROCODONE-ACETAMINOPHEN 5-325 MG PO TABS: 1.0000 | ORAL_TABLET | Freq: Three times a day (TID) | ORAL | Status: DC | PRN

## 2012-04-26 MED ORDER — SODIUM CHLORIDE 0.9 % IV SOLN
1000.0000 mL | Freq: Once | INTRAVENOUS | Status: AC
  Administered 2012-04-26: 1000 mL via INTRAVENOUS

## 2012-04-26 NOTE — ED Notes (Signed)
Lockwood, MD at bedside.  

## 2012-04-26 NOTE — ED Notes (Signed)
Pt c/o left flank pain x 2 days; minimal pain with urination; no blood noted; no fever; previous history of kidney stone but states this feels different

## 2012-04-26 NOTE — ED Provider Notes (Signed)
History     CSN: [id number]  Arrival date & time 04/26/12  1949   First MD Initiated Contact with Patient 04/26/12 2032      No chief complaint on file.    HPI  The patient presents with flank pain.  She states that the pain began approximately 2 days ago, with Dr. precipitant.  Since onset the pain has been persistent, is sharp, focally about the left flank with minimal radiation.  There is no hematuria, minimal dysuria, no fever, no chills, no nausea, no vomiting. No clear alleviating or exacerbating factors.  Past Medical History  Diagnosis Date  . Pulmonary sarcoidosis   . Glucose-6-phosphate dehydrogenase deficiency   . Kidney stone     Past Surgical History  Procedure Laterality Date  . Cystine stone removal      Family History  Problem Relation Age of Onset  . Sarcoidosis      cousin    History  Substance Use Topics  . Smoking status: Former Smoker -- 0.50 packs/day for 10 years    Types: Cigarettes    Quit date: 10/16/2008  . Smokeless tobacco: Not on file  . Alcohol Use: No    OB History   Grav Para Term Preterm Abortions TAB SAB Ect Mult Living                  Review of Systems  Constitutional:       Per HPI, otherwise negative  HENT:       Per HPI, otherwise negative  Respiratory:       Per HPI, otherwise negative  Cardiovascular:       Per HPI, otherwise negative  Gastrointestinal: Negative for vomiting.  Endocrine:       Negative aside from HPI  Genitourinary:       Neg aside from HPI   Musculoskeletal:       Per HPI, otherwise negative  Skin: Negative.   Neurological: Negative for syncope.    Allergies  Sulfonamide derivatives  Home Medications   Current Outpatient Rx  Name  Route  Sig  Dispense  Refill  . ibuprofen (ADVIL,MOTRIN) 200 MG tablet   Oral   Take 800 mg by mouth 3 (three) times daily as needed for pain. For pain           BP 102/64  Pulse 99  Temp(Src) 98.4 F (36.9 C) (Oral)  Resp 17  SpO2 98%  LMP  04/19/2012  Physical Exam  Nursing note and vitals reviewed. Constitutional: She is oriented to person, place, and time. She appears well-developed and well-nourished. No distress.  HENT:  Head: Normocephalic and atraumatic.  Eyes: Conjunctivae and EOM are normal.  Cardiovascular: Normal rate and regular rhythm.   Pulmonary/Chest: Effort normal and breath sounds normal. No stridor. No respiratory distress.  Abdominal: She exhibits no distension. There is CVA tenderness.  R CVA TTP  Musculoskeletal: She exhibits no edema.  Neurological: She is alert and oriented to person, place, and time. No cranial nerve deficit.  Skin: Skin is warm and dry.  Psychiatric: She has a normal mood and affect.    ED Course  Procedures (including critical care time)  Labs Reviewed  URINALYSIS, ROUTINE W REFLEX MICROSCOPIC - Abnormal; Notable for the following:    APPearance CLOUDY (*)    Leukocytes, UA SMALL (*)    All other components within normal limits  CBC WITH DIFFERENTIAL - Abnormal; Notable for the following:    Hemoglobin 10.8 (*)  HCT 33.1 (*)    MCV 68.0 (*)    MCH 22.2 (*)    All other components within normal limits  COMPREHENSIVE METABOLIC PANEL - Abnormal; Notable for the following:    Sodium 130 (*)    Chloride 93 (*)    Creatinine, Ser 1.31 (*)    Total Protein 9.2 (*)    Albumin 3.1 (*)    GFR calc non Af Amer 54 (*)    GFR calc Af Amer 63 (*)    All other components within normal limits  URINE MICROSCOPIC-ADD ON - Abnormal; Notable for the following:    Squamous Epithelial / LPF MANY (*)    Casts GRANULAR CAST (*)    All other components within normal limits  PREGNANCY, URINE  LIPASE, BLOOD   No results found.   1. Anemia   2. Renal dysfunction    Update: MA the patient aware of all results as far, including the anemia, the absence of hematuria, gross evidence of infection.  She states she feels markedly better.  We discussed the possibilities for her flank pain,  including renal dysfunction, progression of sarcoid, occult infection.   MDM  This young female presents with flank pain.  Given the patient's history of kidney stones, this was an initial consideration.  Additionally, however the patient has a history of sarcoidosis, and there is some suspicion of progression of disease.  There is no evidence of stones on urinalysis, the patient's labs were largely reassuring, though her creatinine is increasing, and she has evidence of anemia.  Given the absence of distress, fever, the patient was discharged in stable condition after lengthy discussion on the need for continued outpatient followup, with consideration of renal ultrasound.  The patient was provided resources to assist with obtaining a primary care physician, specialist care.    Gerhard Munch, MD 04/26/12 [phone]

## 2012-12-02 NOTE — Progress Notes (Signed)
This encounter was created in error - please disregard.

## 2013-04-06 ENCOUNTER — Ambulatory Visit (INDEPENDENT_AMBULATORY_CARE_PROVIDER_SITE_OTHER): Payer: BC Managed Care – PPO | Admitting: Internal Medicine

## 2013-04-06 ENCOUNTER — Ambulatory Visit (INDEPENDENT_AMBULATORY_CARE_PROVIDER_SITE_OTHER)
Admission: RE | Admit: 2013-04-06 | Discharge: 2013-04-06 | Disposition: A | Discharge status: Home / Self Care | Payer: BC Managed Care – PPO | Source: Ambulatory Visit | Attending: Internal Medicine | Admitting: Internal Medicine

## 2013-04-06 ENCOUNTER — Encounter: Payer: Self-pay | Admitting: Internal Medicine

## 2013-04-06 VITALS — BP 98/66 | HR 102 | Temp 98.4°F | Ht 64.0 in | Wt 166.0 lb

## 2013-04-06 DIAGNOSIS — R059 Cough, unspecified: Secondary | ICD-10-CM | POA: Insufficient documentation

## 2013-04-06 DIAGNOSIS — D869 Sarcoidosis, unspecified: Secondary | ICD-10-CM

## 2013-04-06 DIAGNOSIS — R05 Cough: Secondary | ICD-10-CM

## 2013-04-06 MED ORDER — AMOXICILLIN-POT CLAVULANATE 875-125 MG PO TABS: 1.0000 | ORAL_TABLET | Freq: Two times a day (BID) | ORAL | Status: DC

## 2013-04-06 MED ORDER — PREDNISONE 10 MG PO TABS: ORAL_TABLET | ORAL | Status: DC

## 2013-04-06 NOTE — Progress Notes (Signed)
Quick Note:  Spoke with pt and notified of results per Dr. Wert. Pt verbalized understanding and denied any questions.  ______ 

## 2013-04-06 NOTE — Patient Instructions (Signed)
Prednisone 10 mg take  4 each am x 2 days,   2 each am x 2 days,  1 each am x 2 days and stop Augmentin 875 mg take one pill twice daily  X 10 days - take at breakfast and supper with large glass of water.  It would help reduce the usual side effects (diarrhea and yeast infections) if you ate cultured yogurt at lunch.   If not better after complete these medications call [facility] at [phone] and schedule sinus ct   Please remember to go to the x-ray department downstairs for your tests - we will call you with the results when they are available.     Please schedule a follow up visit in 3 months but call sooner if needed with PFT's

## 2013-04-06 NOTE — Progress Notes (Signed)
Subjective:     Patient ID: Kara Atkinson, female   DOB: Apr 24, 1980, 33 y.o.   MRN: [id number]    Brief patient profile:  32 yobf quit smoking 10/2008 with dx of sarcoid at [facility] aug 2009 with weakness and wt loss sob and cough and dx made by tbbx and placed on prednisone daily but stopped around early 2014   History of Present Illness  September 06, 2009 1st pulm ov c/o aches and pains with doe x steps on prednisone 10mg  and can do one flight without stopping. Now off prednisone x sev months with progressively worsening indolent onset constant Low Back and thighs not wrists, not ankles, no rash assoc with mild dry cough and decrease activity tol due to sob. Never saw eye doctor.  Much worse off prednisone to point where needs to 5 advil at bedtime. rec Ok to use advil up to 4 with meal as needed for joint pain  Prednisone 10 mg 2 daily until btter then one daily until return  Need an eye evaluation> done.   October 16, 2009 Followup with PFT's. Pt states doing well and denies any complaints. no more aches or pains.   rec Ceiling for prednisone is 20 mg per day if you get worse, but as long as you feel better ok to a floor of 5 mg per day> lost to f/u due to insurance, off pred around 03/2012    04/06/2013 f/u ov/Abegail Kloeppel re: cough x 3 months, in am  Chief Complaint  Patient presents with  . Follow-up    Pt c/o SOB with exertion, prod cough with yellow mucous in AM only.     cough p am  Bath  Mostly, no worse since off prednisone  No obvious day to day or daytime variabilty or assoc mild doe x running,    No  cp or chest tightness, subjective wheeze overt sinus or hb symptoms. No unusual exp hx or h/o childhood pna/ asthma or knowledge of premature birth.  Sleeping ok without nocturnal  or early am exacerbation  of respiratory  c/o's or need for noct saba. Also denies any obvious fluctuation of symptoms with weather or environmental changes or other aggravating or alleviating factors except as outlined  above   Current Medications, Allergies, Complete Past Medical History, Past Surgical History, Family History, and Social History were reviewed in [facility] electronic medical record.  ROS  The following are not active complaints unless bolded sore throat, dysphagia, dental problems, itching, sneezing,  nasal congestion or excess/ purulent secretions, ear ache,   fever, chills, sweats, unintended wt loss, pleuritic or exertional cp, hemoptysis,  orthopnea pnd or leg swelling, presyncope, palpitations, heartburn, abdominal pain, anorexia, nausea, vomiting, diarrhea  or change in bowel or urinary habits, change in stools or urine, dysuria,hematuria,  rash, arthralgias, visual complaints, headache, numbness weakness or ataxia or problems with walking or coordination,  change in mood/affect or memory.          Allergies   1) ! 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 Nl external ear canals without cough reflex  NECK : without JVD/Nodes/TM/ nl carotid upstrokes bilaterally  LUNGS: no acc muscle use, clear to A and P bilaterally without cough on insp or exp maneuvers  CV: RRR no s3 or murmur or increase in P2, no edema  ABD: soft and nontender with nl excursion in the supine position. No bruits or organomegaly, bowel sounds nl  MS: warm without deformities, calf tenderness, cyanosis or clubbing    CXR  04/06/2013 :  Findings consistent with adenopathy and pulmonary interstitial  lung disease consistent with sarcoidosis. Similar findings noted  on  prior chest x-ray of 10/16/2009.   Assessment:

## 2013-04-08 NOTE — Assessment & Plan Note (Addendum)
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   These are the same pts (now being labeled as having "irritable larynx syndrome" by some cough centers) who not infrequently have a history of having failed to tolerate ace inhibitors,  dry powder inhalers or biphosphonates or report having atypical reflux symptoms that don't respond to standard doses of PPI , and are easily confused as having aecopd or asthma flares by even experienced allergists/ pulmonologists.   For now rx short term for rhinitis/sinusitis  and next step sinus ct if not better

## 2013-04-08 NOTE — Assessment & Plan Note (Signed)
Dx August 2009 [facility]  - ACE level September 07, 2009 = 100  - Opth eval rec September 07, 2009 > [facility]  - PFT's October 16, 2009 VC 63% no airflow obstruction, DLCO 54 > corrects to 109   - off pred since 03/2012   No convincing evidence of dz activity > the cough is more typical of uacs

## 2013-05-24 ENCOUNTER — Ambulatory Visit (INDEPENDENT_AMBULATORY_CARE_PROVIDER_SITE_OTHER): Payer: BC Managed Care – PPO | Admitting: Internal Medicine

## 2013-05-24 ENCOUNTER — Encounter: Payer: Self-pay | Admitting: Internal Medicine

## 2013-05-24 VITALS — BP 92/60 | HR 74 | Temp 97.3°F | Ht 64.0 in | Wt 169.4 lb

## 2013-05-24 DIAGNOSIS — R05 Cough: Secondary | ICD-10-CM

## 2013-05-24 DIAGNOSIS — R059 Cough, unspecified: Secondary | ICD-10-CM

## 2013-05-24 DIAGNOSIS — D869 Sarcoidosis, unspecified: Secondary | ICD-10-CM

## 2013-05-24 NOTE — Progress Notes (Signed)
Subjective:     Patient ID: Kara Atkinson, female   DOB: [birth year], 33 y.o.   MRN: [id number]    Brief patient profile:  32 yobf quit smoking 10/2008 with dx of sarcoid at [facility] aug 2009 with weakness and wt loss sob and cough and dx made by tbbx and placed on prednisone daily but stopped around early 2014   History of Present Illness  September 06, 2009 1st pulm ov c/o aches and pains with doe x steps on prednisone 10mg  and can do one flight without stopping. Now off prednisone x sev months with progressively worsening indolent onset constant Low Back and thighs not wrists, not ankles, no rash assoc with mild dry cough and decrease activity tol due to sob. Never saw eye doctor.  Much worse off prednisone to point where needs to 5 advil at bedtime. rec Ok to use advil up to 4 with meal as needed for joint pain  Prednisone 10 mg 2 daily until btter then one daily until return  Need an eye evaluation> done.   October 16, 2009 Followup with PFT's. Pt states doing well and denies any complaints. no more aches or pains.   rec Ceiling for prednisone is 20 mg per day if you get worse, but as long as you feel better ok to a floor of 5 mg per day> lost to f/u due to insurance, off pred around 03/2012    04/06/2013 f/u ov/Kara Atkinson re: cough x 3 months, in am  Chief Complaint  Patient presents with  . Follow-up    Pt c/o SOB with exertion, prod cough with yellow mucous in AM only.    cough p am  Bath  Mostly, no worse since off prednisone rec Prednisone 10 mg take  4 each am x 2 days,   2 each am x 2 days,  1 each am x 2 days and stop Augmentin 875 mg take one pill twice daily  X 10 days - >  No change in cough at all   05/24/2013 f/u ov/Kara Atkinson re: sarcoidosis/ cough ? related Chief Complaint  Patient presents with  . Follow-up    Pt states SOB and cough are unchagned since her last visit. No new co's today.   no change cough, Not limited by breathing from desired activities  (can work out)  Cough is after am  routine including taking a  bath > yellow thick mucus x 5 min then resolves for the res to the day   No obvious day to day or daytime variabilty or assoc    cp or chest tightness, subjective wheeze overt sinus or hb symptoms. No unusual exp hx or h/o childhood pna/ asthma or knowledge of premature birth.  Sleeping ok without nocturnal  or early am exacerbation  of respiratory  c/o's or need for noct saba. Also denies any obvious fluctuation of symptoms with weather or environmental changes or other aggravating or alleviating factors except as outlined above   Current Medications, Allergies, Complete Past Medical History, Past Surgical History, Family History, and Social History were reviewed in [facility] electronic medical record.  ROS  The following are not active complaints unless bolded sore throat, dysphagia, dental problems, itching, sneezing,  nasal congestion or excess/ purulent secretions, ear ache,   fever, chills, sweats, unintended wt loss, pleuritic or exertional cp, hemoptysis,  orthopnea pnd or leg swelling, presyncope, palpitations, heartburn, abdominal pain, anorexia, nausea, vomiting, diarrhea  or change in bowel or urinary habits, change in stools or  urine, dysuria,hematuria,  rash, arthralgias, visual complaints, headache, numbness weakness or ataxia or problems with walking or coordination,  change in mood/affect or memory.          Allergies   1) ! 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 Nl external ear  canals without cough reflex  NECK : without JVD/Nodes/TM/ nl carotid upstrokes bilaterally  LUNGS: no acc muscle use, clear to A and P bilaterally without cough on insp or exp maneuvers  CV: RRR no s3 or murmur or increase in P2, no edema  ABD: soft and nontender with nl excursion in the supine position. No bruits or organomegaly, bowel sounds nl  MS: warm without deformities, calf tenderness, cyanosis or clubbing    CXR  04/06/2013 :  Findings consistent with adenopathy and pulmonary interstitial  lung disease consistent with sarcoidosis. Similar findings noted on  prior chest x-ray of 10/16/2009.   Assessment:

## 2013-05-24 NOTE — Patient Instructions (Addendum)
Please see patient coordinator before you leave today  to schedule sinus ct  Please schedule a follow up office visit in 6 weeks, call sooner if needed with pfts

## 2013-05-25 NOTE — Assessment & Plan Note (Signed)
Dx August 2009 [facility]  - ACE level September 07, 2009 = 100  - Opth eval rec September 07, 2009 > [facility]  - PFT's October 16, 2009 VC 63% no airflow obstruction, DLCO 54 > corrects to 109   - off pred since 03/2012   No evidence of active sarcoid but needs repeat baseline pfts

## 2013-05-25 NOTE — Assessment & Plan Note (Signed)
Followed in Pulmonary clinic/ [facility]/ Rivka Baune - Nov 2014 - no better 03/2013 on augmentin / prednisone, not even transiently  Most likely this is not sarcoid related but rather  Classic Upper airway cough syndrome, so named because it's frequently impossible to sort out how much is  CR/sinusitis with freq throat clearing (which can be related to primary GERD)   vs  causing  secondary (" extra esophageal")  GERD from wide swings in gastric pressure that occur with throat clearing, often  promoting self use of mint and menthol lozenges that reduce the lower esophageal sphincter tone and exacerbate the problem further in a cyclical fashion.   These are the same pts (now being labeled as having "irritable larynx syndrome" by some cough centers) who not infrequently have a history of having failed to tolerate ace inhibitors,  dry powder inhalers or biphosphonates or report having atypical reflux symptoms that don't respond to standard doses of PPI , and are easily confused as having aecopd or asthma flares by even experienced allergists/ pulmonologists.  Next step is sinus ct then add zyrtec or 1st gen H1 per guidelines

## 2013-05-31 ENCOUNTER — Encounter: Payer: Self-pay | Admitting: Internal Medicine

## 2013-05-31 ENCOUNTER — Ambulatory Visit (INDEPENDENT_AMBULATORY_CARE_PROVIDER_SITE_OTHER)
Admission: RE | Admit: 2013-05-31 | Discharge: 2013-05-31 | Disposition: A | Discharge status: Home / Self Care | Payer: BC Managed Care – PPO | Source: Ambulatory Visit | Attending: Internal Medicine | Admitting: Internal Medicine

## 2013-05-31 DIAGNOSIS — R05 Cough: Secondary | ICD-10-CM

## 2013-05-31 DIAGNOSIS — R059 Cough, unspecified: Secondary | ICD-10-CM

## 2013-05-31 NOTE — Progress Notes (Signed)
Quick Note:  Spoke with pt and notified of results per Dr. Wert. Pt verbalized understanding and denied any questions.  ______ 

## 2013-07-05 ENCOUNTER — Ambulatory Visit: Payer: BC Managed Care – PPO | Admitting: Internal Medicine

## 2013-07-30 ENCOUNTER — Ambulatory Visit: Payer: BC Managed Care – PPO | Admitting: Internal Medicine

## 2014-12-28 ENCOUNTER — Ambulatory Visit: Payer: Self-pay | Admitting: Internal Medicine

## 2015-01-09 ENCOUNTER — Ambulatory Visit (INDEPENDENT_AMBULATORY_CARE_PROVIDER_SITE_OTHER): Payer: BLUE CROSS/BLUE SHIELD | Admitting: Internal Medicine

## 2015-01-09 ENCOUNTER — Ambulatory Visit (INDEPENDENT_AMBULATORY_CARE_PROVIDER_SITE_OTHER)
Admission: RE | Admit: 2015-01-09 | Discharge: 2015-01-09 | Disposition: A | Discharge status: Home / Self Care | Payer: BLUE CROSS/BLUE SHIELD | Source: Ambulatory Visit | Attending: Internal Medicine | Admitting: Internal Medicine

## 2015-01-09 ENCOUNTER — Encounter: Payer: Self-pay | Admitting: Internal Medicine

## 2015-01-09 VITALS — BP 100/64 | HR 62 | Ht 64.0 in | Wt 171.0 lb

## 2015-01-09 DIAGNOSIS — D869 Sarcoidosis, unspecified: Secondary | ICD-10-CM

## 2015-01-09 NOTE — Progress Notes (Signed)
Subjective:     Patient ID: Kara Atkinson, female   DOB: Apr 30, 1980    MRN: [id number]    Brief patient profile:  33 yobf quit smoking 10/2008 with dx of sarcoid at [facility] aug 2009 with weakness and wt loss sob and cough and dx made by tbbx and placed on prednisone daily but stopped around early 2014   History of Present Illness  September 06, 2009 1st pulm ov c/o aches and pains with doe x steps on prednisone 10mg  and can do one flight without stopping. Now off prednisone x sev months with progressively worsening indolent onset constant Low Back and thighs not wrists, not ankles, no rash assoc with mild dry cough and decrease activity tol due to sob. Never saw eye doctor.  Much worse off prednisone to point where needs to 5 advil at bedtime. rec Ok to use advil up to 4 with meal as needed for joint pain  Prednisone 10 mg 2 daily until btter then one daily until return  Need an eye evaluation> done.   October 16, 2009 Followup with PFT's. Pt states doing well and denies any complaints. no more aches or pains.   rec Ceiling for prednisone is 20 mg per day if you get worse, but as long as you feel better ok to a floor of 5 mg per day> lost to f/u due to insurance, off pred around 03/2012    04/06/2013 f/u ov/Kara Atkinson re: cough x 3 months, in am  Chief Complaint  Patient presents with  . Follow-up    Pt c/o SOB with exertion, prod cough with yellow mucous in AM only.    cough p am  Bath  Mostly, no worse since off prednisone rec Prednisone 10 mg take  4 each am x 2 days,   2 each am x 2 days,  1 each am x 2 days and stop Augmentin 875 mg take one pill twice daily  X 10 days - >  No change in cough at all   05/24/2013 f/u ov/Kara Atkinson re: sarcoidosis/ cough ? related Chief Complaint  Patient presents with  . Follow-up    Pt states SOB and cough are unchagned since her last visit. No new co's today.  no change cough, Not limited by breathing from desired activities  (can work out)  Cough is after am routine  including taking a  bath > yellow thick mucus x 5 min then resolves for the res to the day rec Check Sinus CT > neg pfts > never done   01/09/2015  f/u ov/Kara Atkinson re: f/u sarcoid off rx x 2014 Chief Complaint  Patient presents with  . Follow-up    Pt states she is doing well. She wanted to come in today to f/u on sarcoid.      No   cough or sob    cp or chest tightness, subjective wheeze overt sinus or hb symptoms. No unusual exp hx or h/o childhood pna/ asthma or knowledge of premature birth.  Sleeping ok without nocturnal  or early am exacerbation  of respiratory  c/o's or need for noct saba. Also denies any obvious fluctuation of symptoms with weather or environmental changes or other aggravating or alleviating factors except as outlined above   Current Medications, Allergies, Complete Past Medical History, Past Surgical History, Family History, and Social History were reviewed in [facility] electronic medical record.  ROS  The following are not active complaints unless bolded sore throat, dysphagia, dental problems, itching, sneezing,  nasal congestion or excess/ purulent secretions, ear ache,   fever, chills, sweats, unintended wt loss, pleuritic or exertional cp, hemoptysis,  orthopnea pnd or leg swelling, presyncope, palpitations, heartburn, abdominal pain, anorexia, nausea, vomiting, diarrhea  or change in bowel or urinary habits, change in stools or urine, dysuria,hematuria,  rash, arthralgias, visual complaints, headache, numbness weakness or ataxia or problems with walking or coordination,  change in mood/affect or memory.           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 Nl external ear canals without cough reflex  NECK : without JVD/Nodes/TM/ nl carotid upstrokes bilaterally  LUNGS: no acc muscle use, clear to A and P bilaterally without cough on insp or exp maneuvers  CV: RRR no s3 or murmur or increase in P2, no edema  ABD: soft and nontender with nl excursion in the supine position. No bruits or organomegaly, bowel sounds nl  MS: warm without deformities, calf tenderness, cyanosis or clubbing      CXR PA and Lateral:   01/09/2015 :    I personally reviewed images and agree with radiology impression as follows:   Residual bilateral coarse upper lobe predominant interstitial opacities are somewhat improved from recent prior exam. Findings are compatible with sarcoidosis.   Assessment:

## 2015-01-09 NOTE — Patient Instructions (Addendum)
Please remember to go to the x-ray department downstairs for your tests - we will call you with the results when they are available.  Schedule pfts and we call you with the results so we can compare in future if any symptoms return   Pulmonary follow up is as needed as you do not appear to have active sarcoid and 6 years out from the diagnosis I doubt it will return

## 2015-01-12 NOTE — Assessment & Plan Note (Addendum)
Dx August 2009 [facility]CV  - ACE level September 07, 2009 = 100  - Opth eval rec September 07, 2009 > [facility]  - PFT's October 16, 2009 VC 63% no airflow obstruction, DLCO 54 > corrects to 109  -  off pred since 03/2012   I had an extended final summary discussion with the patient reviewing all relevant studies completed to date and  lasting 10 minutes of a 15 minute visit on the following issues:    Very unlikely that sarcoid will ever flare again although she does have mild scarring related to previous active granulomatous inflammation and a baseline set of PFTs would be nice to have for future reference. However, no regular pulmonary follow-up is needed at this point.

## 2015-02-01 ENCOUNTER — Ambulatory Visit (INDEPENDENT_AMBULATORY_CARE_PROVIDER_SITE_OTHER): Payer: BLUE CROSS/BLUE SHIELD | Admitting: Internal Medicine

## 2015-02-01 DIAGNOSIS — D869 Sarcoidosis, unspecified: Secondary | ICD-10-CM | POA: Diagnosis not present

## 2015-02-01 LAB — PULMONARY FUNCTION TEST
DL/VA % pred: 111 %
DL/VA: 5.48 ml/min/mmHg/L
DLCO UNC % PRED: 70 %
DLCO unc: 18.15 ml/min/mmHg
FEF 25-75 Post: 2.47 L/sec
FEF 25-75 Pre: 1.98 L/sec
FEF2575-%CHANGE-POST: 24 %
FEF2575-%PRED-POST: 77 %
FEF2575-%PRED-PRE: 62 %
FEV1-%Change-Post: 5 %
FEV1-%PRED-POST: 78 %
FEV1-%Pred-Pre: 74 %
FEV1-Post: 2.19 L
FEV1-Pre: 2.07 L
FEV1FVC-%Change-Post: 4 %
FEV1FVC-%PRED-PRE: 95 %
FEV6-%CHANGE-POST: 0 %
FEV6-%Pred-Post: 79 %
FEV6-%Pred-Pre: 78 %
FEV6-Post: 2.58 L
FEV6-Pre: 2.56 L
FEV6FVC-%PRED-PRE: 101 %
FEV6FVC-%Pred-Post: 101 %
FVC-%CHANGE-POST: 0 %
FVC-%Pred-Post: 78 %
FVC-%Pred-Pre: 77 %
FVC-Post: 2.58 L
FVC-Pre: 2.56 L
POST FEV1/FVC RATIO: 85 %
Post FEV6/FVC ratio: 100 %
Pre FEV1/FVC ratio: 81 %
Pre FEV6/FVC Ratio: 100 %
RV % pred: 53 %
RV: 0.8 L
TLC % pred: 66 %
TLC: 3.43 L

## 2015-02-01 NOTE — Progress Notes (Signed)
PFT done today. 

## 2015-02-02 ENCOUNTER — Telehealth: Payer: Self-pay | Admitting: Internal Medicine

## 2015-02-02 NOTE — Telephone Encounter (Signed)
Pt informed of PFT results Pt voiced understanding and stated that she is feeling better  Nothing further is needed

## 2015-02-02 NOTE — Telephone Encounter (Signed)
Done see result notes. 

## 2015-02-02 NOTE — Telephone Encounter (Signed)
Called and spoke with pt  Pt requesting PFT results that were done 02/01/15 Informed pt that results have not been finalized yet from MW, but office would call once resulted  MW, please advise on PFT results. Thanks

## 2016-03-29 DIAGNOSIS — K648 Other hemorrhoids: Secondary | ICD-10-CM | POA: Insufficient documentation

## 2017-06-02 LAB — HM PAP SMEAR: HM Pap smear: NEGATIVE

## 2017-06-04 ENCOUNTER — Other Ambulatory Visit: Payer: Self-pay | Admitting: Obstetrics and Gynecology

## 2017-07-18 ENCOUNTER — Encounter (HOSPITAL_BASED_OUTPATIENT_CLINIC_OR_DEPARTMENT_OTHER): Payer: Self-pay

## 2017-07-18 ENCOUNTER — Ambulatory Visit (HOSPITAL_BASED_OUTPATIENT_CLINIC_OR_DEPARTMENT_OTHER): Admit: 2017-07-18 | Payer: BLUE CROSS/BLUE SHIELD | Admitting: Obstetrics and Gynecology

## 2017-07-18 SURGERY — LIGATION, FALLOPIAN TUBE, LAPAROSCOPIC
Anesthesia: General | Laterality: Bilateral

## 2017-12-09 ENCOUNTER — Other Ambulatory Visit: Payer: Self-pay

## 2017-12-09 ENCOUNTER — Emergency Department (HOSPITAL_COMMUNITY)
Admission: EM | Admit: 2017-12-09 | Discharge: 2017-12-09 | Disposition: A | Discharge status: Home / Self Care | Payer: BLUE CROSS/BLUE SHIELD | Attending: Emergency Medicine | Admitting: Emergency Medicine

## 2017-12-09 ENCOUNTER — Encounter (HOSPITAL_COMMUNITY): Payer: Self-pay

## 2017-12-09 DIAGNOSIS — R590 Localized enlarged lymph nodes: Secondary | ICD-10-CM | POA: Insufficient documentation

## 2017-12-09 DIAGNOSIS — Z87891 Personal history of nicotine dependence: Secondary | ICD-10-CM | POA: Insufficient documentation

## 2017-12-09 DIAGNOSIS — R42 Dizziness and giddiness: Secondary | ICD-10-CM | POA: Insufficient documentation

## 2017-12-09 DIAGNOSIS — R0989 Other specified symptoms and signs involving the circulatory and respiratory systems: Secondary | ICD-10-CM

## 2017-12-09 LAB — CBC WITH DIFFERENTIAL/PLATELET
BASOS PCT: 1 %
Basophils Absolute: 0 10*3/uL (ref 0.0–0.1)
EOS PCT: 3 %
Eosinophils Absolute: 0.1 10*3/uL (ref 0.0–0.7)
HCT: 39.9 % (ref 36.0–46.0)
HEMOGLOBIN: 12.8 g/dL (ref 12.0–15.0)
Lymphocytes Relative: 41 %
Lymphs Abs: 1.1 10*3/uL (ref 0.7–4.0)
MCH: 24.1 pg — ABNORMAL LOW (ref 26.0–34.0)
MCHC: 32.1 g/dL (ref 30.0–36.0)
MCV: 75 fL — ABNORMAL LOW (ref 78.0–100.0)
MONOS PCT: 10 %
Monocytes Absolute: 0.3 10*3/uL (ref 0.1–1.0)
Neutro Abs: 1.2 10*3/uL — ABNORMAL LOW (ref 1.7–7.7)
Neutrophils Relative %: 45 %
Platelets: 151 10*3/uL (ref 150–400)
RBC: 5.32 MIL/uL — ABNORMAL HIGH (ref 3.87–5.11)
RDW: 14.4 % (ref 11.5–15.5)
WBC: 2.6 10*3/uL — AB (ref 4.0–10.5)

## 2017-12-09 LAB — COMPREHENSIVE METABOLIC PANEL
ALK PHOS: 32 U/L — AB (ref 38–126)
ALT: 14 U/L (ref 0–44)
AST: 22 U/L (ref 15–41)
Albumin: 4.2 g/dL (ref 3.5–5.0)
Anion gap: 8 (ref 5–15)
BUN: 14 mg/dL (ref 6–20)
CALCIUM: 9.6 mg/dL (ref 8.9–10.3)
CO2: 25 mmol/L (ref 22–32)
Chloride: 105 mmol/L (ref 98–111)
Creatinine, Ser: 1.03 mg/dL — ABNORMAL HIGH (ref 0.44–1.00)
GFR calc non Af Amer: >60 mL/min (ref >60)
Glucose, Bld: 101 mg/dL — ABNORMAL HIGH (ref 70–99)
Potassium: 4.5 mmol/L (ref 3.5–5.1)
Sodium: 138 mmol/L (ref 135–145)
Total Bilirubin: 1 mg/dL (ref 0.3–1.2)
Total Protein: 8.6 g/dL — ABNORMAL HIGH (ref 6.5–8.1)

## 2017-12-09 LAB — I-STAT BETA HCG BLOOD, ED (MC, WL, AP ONLY): I-stat hCG, quantitative: <5 m[IU]/mL (ref <5)

## 2017-12-09 NOTE — ED Provider Notes (Signed)
[facility]-EMERGENCY DEPT Provider Note   CSN: [id number] Arrival date & time: 12/09/17  0703     History   Chief Complaint Chief Complaint  Patient presents with  . Dizziness  . Adenopathy    HPI Kara Atkinson is a 37 y.o. female.  HPI  37 year old female presents with concern for neck lymphadenopathy and intermittent dizziness.  She is noticed palpable lymph nodes to her neck, mostly on the left side for about 3 months.  They are not painful and seem to be about the same size when she originally felt them.  She also notes intermittent dizziness.  Usually occurs when she standing up or occasionally when she walks she will feel off balance briefly.  Denies any headaches, blurry vision, vomiting, chest pain or abdominal pain, or weight loss/gain.  Has not noticed any lymph nodes to axilla or groin. She does not have a PCP.  Past Medical History:  Diagnosis Date  . Glucose-6-phosphate dehydrogenase deficiency (HCC)   . Kidney stone   . Pulmonary sarcoidosis [facility])     Patient Active Problem List   Diagnosis Date Noted  . Cough 04/06/2013  . PULMONARY SARCOIDOSIS 09/06/2009  . GLUCOSE-6-PHOSPHATE DEHYDROGENASE DEFICIENCY 09/06/2009    Past Surgical History:  Procedure Laterality Date  . cystine stone removal       OB History   None      Home Medications    Prior to Admission medications   Not on File    Family History Family History  Problem Relation Age of Onset  . Sarcoidosis Unknown        cousin    Social History Social History   Tobacco Use  . Smoking status: Former Smoker    Packs/day: 0.50    Years: 10.00    Pack years: 5.00    Types: Cigarettes    Last attempt to quit: 10/16/2008    Years since quitting: 9.1  . Smokeless tobacco: Never Used  Substance Use Topics  . Alcohol use: No  . Drug use: No     Allergies   Sulfonamide derivatives   Review of Systems Review of Systems  Constitutional: Negative for fever  and unexpected weight change.  Eyes: Negative for visual disturbance.  Respiratory: Negative for shortness of breath.   Cardiovascular: Negative for chest pain.  Gastrointestinal: Negative for abdominal pain and vomiting.  Neurological: Positive for dizziness. Negative for headaches.  Hematological: Positive for adenopathy.  All other systems reviewed and are negative.    Physical Exam Updated Vital Signs BP 110/69 (BP Location: Right Arm)   Pulse 75   Temp 98.9 F (37.2 C) (Oral)   Resp 16   Ht 5\' 4"  (1.626 m)   Wt 77.1 kg   LMP 11/09/2017   SpO2 100%   BMI 29.18 kg/m   Physical Exam  Constitutional: She is oriented to person, place, and time. She appears well-developed and well-nourished. No distress.  HENT:  Head: Normocephalic and atraumatic.  Right Ear: Tympanic membrane and external ear normal.  Left Ear: Tympanic membrane and external ear normal.  Nose: Nose normal.  Mouth/Throat: No oropharyngeal exudate.  Eyes: Pupils are equal, round, and reactive to light. EOM are normal. Right eye exhibits no discharge. Left eye exhibits no discharge.  Neck: Neck supple.  On exam, there is possible palpable superficial cervical lymph nodes on the left, but is small (<0.5 cm). Mild submandibular palpable lymph nodes bilaterally, but don't seem large. No obvious supraclavicular lymph nodes.  Cardiovascular: Normal rate, regular rhythm and normal heart sounds.  Pulmonary/Chest: Effort normal and breath sounds normal.  Abdominal: Soft. There is no tenderness.  Neurological: She is alert and oriented to person, place, and time.  CN 3-12 grossly intact. 5/5 strength in all 4 extremities. Grossly normal sensation. Normal finger to nose.   Skin: Skin is warm and dry. She is not diaphoretic.  Nursing note and vitals reviewed.    ED Treatments / Results  Labs (all labs ordered are listed, but only abnormal results are displayed) Labs Reviewed  COMPREHENSIVE METABOLIC PANEL -  Abnormal; Notable for the following components:      Result Value   Glucose, Bld 101 (*)    Creatinine, Ser 1.03 (*)    Total Protein 8.6 (*)    Alkaline Phosphatase 32 (*)    All other components within normal limits  CBC WITH DIFFERENTIAL/PLATELET - Abnormal; Notable for the following components:   WBC 2.6 (*)    RBC 5.32 (*)    MCV 75.0 (*)    MCH 24.1 (*)    Neutro Abs 1.2 (*)    All other components within normal limits  I-STAT BETA HCG BLOOD, ED ([facility], [facility], [facility] ONLY)    EKG None  Radiology No results found.  Procedures Procedures (including critical care time)  Medications Ordered in ED Medications - No data to display   Initial Impression / Assessment and Plan / ED Course  I have reviewed the triage vital signs and the nursing notes.  Pertinent labs & imaging results that were available during my care of the patient were reviewed by me and considered in my medical decision making (see chart for details).     Patient's lab work is overall similar to baseline.  She has a mildly low WBC of 2.6 which has been noted before.  Hemoglobin okay.  No clear cause for her intermittent dizziness for weeks and her neuro exam is benign.  Unclear if what she is feeling is truly a lymph node or not, but it does appear small and does not appear infected based on no pain or tenderness.  I think the most important thing for her is to get a PCP which I have explained for further work-up.  Final Clinical Impressions(s) / ED Diagnoses   Final diagnoses:  Lymph node symptom    ED Discharge Orders    None       Pricilla LovelessGoldston, Leonor Darnell, MD 12/09/17 [phone]

## 2017-12-09 NOTE — Discharge Instructions (Addendum)
It is very important to find a primary care physician to help work-up possible lymph node pathology.  If you develop severely worsening symptoms, continuous dizziness, fever, neck pain, or any other new/concerning symptoms and return to the ER for evaluation.

## 2017-12-09 NOTE — ED Triage Notes (Signed)
Patient c/o swollen lymph nodes left side of her neck x 3 months. Patient also c/o dizziness x 2 months.

## 2017-12-24 ENCOUNTER — Ambulatory Visit: Payer: Self-pay | Attending: Critical Care Medicine | Admitting: Critical Care Medicine

## 2017-12-24 ENCOUNTER — Ambulatory Visit (HOSPITAL_COMMUNITY)
Admission: RE | Admit: 2017-12-24 | Discharge: 2017-12-24 | Disposition: A | Discharge status: Home / Self Care | Payer: Medicaid Other | Source: Ambulatory Visit | Attending: Critical Care Medicine | Admitting: Critical Care Medicine

## 2017-12-24 ENCOUNTER — Encounter: Payer: Self-pay | Admitting: Critical Care Medicine

## 2017-12-24 VITALS — BP 111/74 | HR 58 | Temp 98.2°F | Resp 16 | Ht 64.0 in | Wt 172.8 lb

## 2017-12-24 DIAGNOSIS — D860 Sarcoidosis of lung: Secondary | ICD-10-CM | POA: Insufficient documentation

## 2017-12-24 DIAGNOSIS — Z882 Allergy status to sulfonamides status: Secondary | ICD-10-CM | POA: Insufficient documentation

## 2017-12-24 DIAGNOSIS — R42 Dizziness and giddiness: Secondary | ICD-10-CM | POA: Insufficient documentation

## 2017-12-24 DIAGNOSIS — Z79899 Other long term (current) drug therapy: Secondary | ICD-10-CM | POA: Insufficient documentation

## 2017-12-24 DIAGNOSIS — N200 Calculus of kidney: Secondary | ICD-10-CM | POA: Insufficient documentation

## 2017-12-24 DIAGNOSIS — Z87442 Personal history of urinary calculi: Secondary | ICD-10-CM | POA: Insufficient documentation

## 2017-12-24 DIAGNOSIS — N852 Hypertrophy of uterus: Secondary | ICD-10-CM | POA: Insufficient documentation

## 2017-12-24 DIAGNOSIS — Z8759 Personal history of other complications of pregnancy, childbirth and the puerperium: Secondary | ICD-10-CM | POA: Insufficient documentation

## 2017-12-24 DIAGNOSIS — R718 Other abnormality of red blood cells: Secondary | ICD-10-CM | POA: Insufficient documentation

## 2017-12-24 DIAGNOSIS — D72819 Decreased white blood cell count, unspecified: Secondary | ICD-10-CM | POA: Insufficient documentation

## 2017-12-24 DIAGNOSIS — Z87891 Personal history of nicotine dependence: Secondary | ICD-10-CM | POA: Insufficient documentation

## 2017-12-24 DIAGNOSIS — D869 Sarcoidosis, unspecified: Secondary | ICD-10-CM

## 2017-12-24 DIAGNOSIS — R590 Localized enlarged lymph nodes: Secondary | ICD-10-CM | POA: Insufficient documentation

## 2017-12-24 NOTE — Progress Notes (Signed)
Subjective:    Patient ID: Kara Atkinson, female    DOB: [birth year], 37 y.o.   MRN: [id number]  37 y.o.F this patient is here for evaluation of lymphadenopathy and previous history of sarcoidosis.  She is noted cervical lymphadenopathy for the past 3 months and associated increased dizziness.  While the lightheadedness is improved she subsequently sought care at the emergency room on 12/09/2017. The patient was found to have very small cervical adenopathy left greater than right neck and no other lymphadenopathy was seen.  The complete blood count did show mild leukopenia with lymphocytosis.  Platelet count also was down 250,000 from previous values of 280,000.  The patient denies any bruising weight loss cough dyspnea fever chills or sweats.  There are no other significant constitutional symptoms.  The patient is here now for evaluation of the lymphadenopathy.  The patient was diagnosed with sarcoidosis in 2011 was followed over period of time for this she has bilateral upper lobe scarring.  There is also mediastinal hilar adenopathy seen.  She had an elevated ACE level.  Her pulmonary function showed mild changes.  She was released from pulmonary care in 2016.  She categorically does states she has no dyspnea cough shortness of breath or other pulmonary complaints.        Past Medical History:  Diagnosis Date  . Glucose-6-phosphate dehydrogenase deficiency   . Kidney stone   . Pulmonary sarcoidosis (HCC)      Family History  Problem Relation Age of Onset  . Sarcoidosis Unknown        cousin     Social History   Socioeconomic History  . Marital status: Married    Spouse name: Not on file  . Number of children: Not on file  . Years of education: Not on file  . Highest education level: Not on file  Occupational History  . Occupation: CNA  Social Needs  . Financial resource strain: Not on file  . Food insecurity:    Worry: Not on file    Inability: Not on file  .  Transportation needs:    Medical: Not on file    Non-medical: Not on file  Tobacco Use  . Smoking status: Former Smoker    Packs/day: 0.50    Years: 10.00    Pack years: 5.00    Types: Cigarettes    Last attempt to quit: 10/16/2008    Years since quitting: 9.1  . Smokeless tobacco: Never Used  Substance and Sexual Activity  . Alcohol use: No  . Drug use: No  . Sexual activity: Not on file  Lifestyle  . Physical activity:    Days per week: Not on file    Minutes per session: Not on file  . Stress: Not on file  Relationships  . Social connections:    Talks on phone: Not on file    Gets together: Not on file    Attends religious service: Not on file    Active member of club or organization: Not on file    Attends meetings of clubs or organizations: Not on file    Relationship status: Not on file  . Intimate partner violence:    Fear of current or ex partner: Not on file    Emotionally abused: Not on file    Physically abused: Not on file    Forced sexual activity: Not on file  Other Topics Concern  . Not on file  Social History Narrative  . Not on file  Allergies  Allergen Reactions  . Sulfonamide Derivatives Other (See Comments)    REACTION: unknown     Outpatient Medications Prior to Visit  Medication Sig Dispense Refill  . Cholecalciferol (VITAMIN D3) 1000 units CAPS Take 1 capsule by mouth daily.    . Multiple Vitamin (THERA) TABS Take 1 tablet by mouth daily.     No facility-administered medications prior to visit.      Review of Systems  Constitutional: Negative for activity change, appetite change, chills, diaphoresis, fatigue and fever.  HENT: Negative for congestion, ear discharge, ear pain, facial swelling, hearing loss, mouth sores, nosebleeds, postnasal drip, rhinorrhea, sinus pressure, sinus pain, sneezing, sore throat, trouble swallowing and voice change.   Eyes: Negative.   Respiratory: Negative for cough, choking, chest tightness, shortness of  breath and wheezing.   Cardiovascular: Negative for chest pain, palpitations and leg swelling.  Gastrointestinal: Negative for abdominal pain, diarrhea, nausea and vomiting.  Endocrine: Negative.   Genitourinary: Negative.   Musculoskeletal: Negative.   Skin: Negative.  Negative for rash.  Neurological: Positive for dizziness. Negative for tremors, seizures, syncope, speech difficulty, weakness, numbness and headaches.  Hematological: Positive for adenopathy. Does not bruise/bleed easily.  Psychiatric/Behavioral: Negative.        Objective:   Physical Exam  Vitals:   12/24/17 0943  BP: 111/74  Pulse: (!) 58  Resp: 16  Temp: 98.2 F (36.8 C)  TempSrc: Oral  SpO2: 99%  Weight: 172 lb 12.8 oz (78.4 kg)  Height: 5\' 4"  (1.626 m)    Gen: Pleasant, well-nourished, in no distress,  normal affect  ENT: No lesions,  mouth clear,  oropharynx clear, no postnasal drip  Neck: No JVD, no TMG, no carotid bruits  Lymphatics: There is a very small tiny pea-sized lymph nodes that are movable in the left anterior cervical chain the submandibular and some axillary nodes are not enlarged the axillary lymph nodes and femoral lymph nodes are not enlarged no other lymph nodes are palpated  Lungs: No use of accessory muscles, no dullness to percussion, clear without rales or rhonchi  Cardiovascular: RRR, heart sounds normal, no murmur or gallops, no peripheral edema  Abdomen: soft and NT, no HSM,  BS normal  Musculoskeletal: No deformities, no cyanosis or clubbing  Neuro: alert, non focal  Skin: Warm, no lesions or rashes, no sign of bruising or petechiae  No results found. Lab Results  Component Value Date   WBC 2.6 (L) 12/09/2017   HGB 12.8 12/09/2017   HCT 39.9 12/09/2017   MCV 75.0 (L) 12/09/2017   PLT 151 12/09/2017   CXR 2016:   IMPRESSION: Residual bilateral coarse upper lobe predominant interstitial opacities are somewhat improved from recent prior exam. Findings  are compatible with sarcoidosis.  PFTs 2016: Completely normal ACE level June 2011 100         Assessment & Plan:  I personally reviewed all images and lab data in the [facility] system as well as any outside material available during this office visit and agree with the  radiology impressions.   PULMONARY SARCOIDOSIS There is no evidence of pulmonary sarcoidosis flare at this time there is no respiratory symptom complex noted.  There are small lymph nodes in the cervical chain which are likely benign.  There is a mild concern about the relative leukopenia and lymphocytosis seen on the current differential and a slight drop in the platelet count.  This patient is concerned about these issues and wishes further evaluation Also note the patient  previously was given a diagnosis of G6PD deficiency however this was clarified and is not an active diagnosis the patient does have a chronic history of small  corpuscular volume with target cells on peripheral smear  Plan We will obtain chest x-ray Repeat CBC with differential and obtain pathologist smear review Obtain ACE level  Low mean corpuscular volume (MCV) Low MCV of unclear etiology  Obtain pathologist review of peripheral smear and give consideration to hematology evaluation  Leukopenia Leukopenia with lymphocytosis on differential We will obtain pathologist review of peripheral smear and repeat CBC with differential May need hematology evaluation  Lymphadenopathy of left cervical region There is mild left cervical lymphadenopathy these lymph nodes appear to be nonpathologic It may be related to the CBC findings Note there is no other lymphadenopathy seen in the liver and spleen are normal The lymphadenopathy may be related to sarcoidosis  Will obtain CBC with differential Pathology smear review   Kara Atkinson was seen today for hospitalization follow-up.  Diagnoses and all orders for this visit:  PULMONARY SARCOIDOSIS -     CBC with  Differential/Platelet; Future -     DG Chest 2 View; Future -     Angiotensin converting enzyme; Future -     CBC with Differential/Platelet -     Angiotensin converting enzyme  Leukopenia, unspecified type -     CBC with Differential/Platelet; Future -     CBC with Differential/Platelet -     Cancel: Technologist smear review -     Pathologist smear review  Low mean corpuscular volume (MCV) -     Cancel: Technologist smear review -     Pathologist smear review  Lymphadenopathy of left cervical region

## 2017-12-24 NOTE — Patient Instructions (Addendum)
We will obtain a chest x-ray Lab will be obtained today including an angiotensin-converting enzyme assay and a another CBC We will ask pathology to review the peripheral smear Dr Delford Field will call you with results, likely will refer you to hematology Dr Delford Field will see you again in one week

## 2017-12-24 NOTE — Assessment & Plan Note (Signed)
Leukopenia with lymphocytosis on differential We will obtain pathologist review of peripheral smear and repeat CBC with differential May need hematology evaluation

## 2017-12-24 NOTE — Assessment & Plan Note (Signed)
Low MCV of unclear etiology  Obtain pathologist review of peripheral smear and give consideration to hematology evaluation

## 2017-12-24 NOTE — Assessment & Plan Note (Signed)
There is no evidence of pulmonary sarcoidosis flare at this time there is no respiratory symptom complex noted.  There are small lymph nodes in the cervical chain which are likely benign.  There is a mild concern about the relative leukopenia and lymphocytosis seen on the current differential and a slight drop in the platelet count.  This patient is concerned about these issues and wishes further evaluation Also note the patient previously was given a diagnosis of G6PD deficiency however this was clarified and is not an active diagnosis the patient does have a chronic history of small  corpuscular volume with target cells on peripheral smear  Plan We will obtain chest x-ray Repeat CBC with differential and obtain pathologist smear review Obtain ACE level

## 2017-12-24 NOTE — Assessment & Plan Note (Signed)
There is mild left cervical lymphadenopathy these lymph nodes appear to be nonpathologic It may be related to the CBC findings Note there is no other lymphadenopathy seen in the liver and spleen are normal The lymphadenopathy may be related to sarcoidosis  Will obtain CBC with differential Pathology smear review

## 2017-12-25 ENCOUNTER — Inpatient Hospital Stay: Payer: Medicaid Other

## 2017-12-25 LAB — CBC WITH DIFFERENTIAL/PLATELET
BASOS ABS: 0 10*3/uL (ref 0.0–0.2)
Basos: 1 %
EOS (ABSOLUTE): 0.1 10*3/uL (ref 0.0–0.4)
Eos: 4 %
Hematocrit: 42.9 % (ref 34.0–46.6)
Hemoglobin: 13 g/dL (ref 11.1–15.9)
Immature Grans (Abs): 0 10*3/uL (ref 0.0–0.1)
Immature Granulocytes: 0 %
LYMPHS ABS: 1 10*3/uL (ref 0.7–3.1)
Lymphs: 37 %
MCH: 22.6 pg — ABNORMAL LOW (ref 26.6–33.0)
MCHC: 30.3 g/dL — AB (ref 31.5–35.7)
MCV: 75 fL — ABNORMAL LOW (ref 79–97)
MONOS ABS: 0.2 10*3/uL (ref 0.1–0.9)
Monocytes: 8 %
Neutrophils Absolute: 1.4 10*3/uL (ref 1.4–7.0)
Neutrophils: 50 %
PLATELETS: 221 10*3/uL (ref 150–450)
RBC: 5.74 x10E6/uL — AB (ref 3.77–5.28)
RDW: 14.4 % (ref 12.3–15.4)
WBC: 2.8 10*3/uL — AB (ref 3.4–10.8)

## 2017-12-25 LAB — ANGIOTENSIN CONVERTING ENZYME: Angio Convert Enzyme: 40 U/L (ref 14–82)

## 2017-12-28 NOTE — Progress Notes (Signed)
The Chest  Xray is stable and no active sarcoid Pt is aware

## 2017-12-30 LAB — PATHOLOGIST SMEAR REVIEW
BASOS ABS: 0 10*3/uL (ref 0.0–0.2)
Basos: 0 %
EOS (ABSOLUTE): 0.1 10*3/uL (ref 0.0–0.4)
EOS: 4 %
HEMOGLOBIN: 13.3 g/dL (ref 11.1–15.9)
Hematocrit: 41.5 % (ref 34.0–46.6)
Immature Grans (Abs): 0 10*3/uL (ref 0.0–0.1)
Immature Granulocytes: 0 %
Lymphocytes Absolute: 1 10*3/uL (ref 0.7–3.1)
Lymphs: 37 %
MCH: 24.4 pg — ABNORMAL LOW (ref 26.6–33.0)
MCHC: 32 g/dL (ref 31.5–35.7)
MCV: 76 fL — ABNORMAL LOW (ref 79–97)
MONOCYTES: 5 %
Monocytes Absolute: 0.1 10*3/uL (ref 0.1–0.9)
Neutrophils Absolute: 1.4 10*3/uL (ref 1.4–7.0)
Neutrophils: 54 %
PLATELETS: 214 10*3/uL (ref 150–450)
Path Rev PLTs: NORMAL
RBC: 5.45 x10E6/uL — ABNORMAL HIGH (ref 3.77–5.28)
RDW: 15.5 % — ABNORMAL HIGH (ref 12.3–15.4)
WBC: 2.7 10*3/uL — ABNORMAL LOW (ref 3.4–10.8)

## 2017-12-30 LAB — SPECIMEN STATUS REPORT

## 2017-12-31 ENCOUNTER — Encounter: Payer: Self-pay | Admitting: Critical Care Medicine

## 2017-12-31 ENCOUNTER — Ambulatory Visit: Payer: Self-pay | Attending: Critical Care Medicine | Admitting: Critical Care Medicine

## 2017-12-31 VITALS — BP 106/74 | HR 86 | Temp 98.2°F | Resp 18 | Ht 64.0 in | Wt 173.0 lb

## 2017-12-31 DIAGNOSIS — R718 Other abnormality of red blood cells: Secondary | ICD-10-CM

## 2017-12-31 DIAGNOSIS — R590 Localized enlarged lymph nodes: Secondary | ICD-10-CM | POA: Insufficient documentation

## 2017-12-31 DIAGNOSIS — D869 Sarcoidosis, unspecified: Secondary | ICD-10-CM

## 2017-12-31 DIAGNOSIS — Z79899 Other long term (current) drug therapy: Secondary | ICD-10-CM | POA: Insufficient documentation

## 2017-12-31 DIAGNOSIS — Z882 Allergy status to sulfonamides status: Secondary | ICD-10-CM | POA: Insufficient documentation

## 2017-12-31 DIAGNOSIS — Z87891 Personal history of nicotine dependence: Secondary | ICD-10-CM | POA: Insufficient documentation

## 2017-12-31 DIAGNOSIS — D860 Sarcoidosis of lung: Secondary | ICD-10-CM | POA: Insufficient documentation

## 2017-12-31 DIAGNOSIS — D72819 Decreased white blood cell count, unspecified: Secondary | ICD-10-CM

## 2017-12-31 NOTE — Assessment & Plan Note (Signed)
Pulmonary sarcoidosis is stable at this time note ACE level is 40 and is reduced chest x-ray also is unchanged compared to previous

## 2017-12-31 NOTE — Progress Notes (Signed)
Subjective:    Patient ID: Kara Atkinson, female    DOB: 04-25-1980, 37 y.o.   MRN: [id number]  37 y.o.F this patient is here for evaluation of lymphadenopathy and previous history of sarcoidosis.  She is noted cervical lymphadenopathy for the past 3 months and associated increased dizziness.  While the lightheadedness is improved she subsequently sought care at the emergency room on 12/09/2017. The patient was found to have very small cervical adenopathy left greater than right neck and no other lymphadenopathy was seen.  The complete blood count did show mild leukopenia with lymphocytosis.  Platelet count also was down 150,000 from previous values of 280,000.  The patient denies any bruising weight loss cough dyspnea fever chills or sweats.  There are no other significant constitutional symptoms.  The patient is here now for evaluation of the lymphadenopathy.  The patient was diagnosed with sarcoidosis in 2011 was followed over period of time for this she has bilateral upper lobe scarring.  There is also mediastinal hilar adenopathy seen.  She had an elevated ACE level.  Her pulmonary function showed mild changes.  She was released from pulmonary care in 2016.  She categorically does states she has no dyspnea cough shortness of breath or other pulmonary complaints.     12/31/2017 Here to f/u on painful lymph nodes on abdomen and Low MCV    Past Medical History:  Diagnosis Date  . Glucose-6-phosphate dehydrogenase deficiency   . Kidney stone   . Pulmonary sarcoidosis (HCC)      Family History  Problem Relation Age of Onset  . Sarcoidosis Unknown        cousin     Social History   Socioeconomic History  . Marital status: Married    Spouse name: Not on file  . Number of children: Not on file  . Years of education: Not on file  . Highest education level: Not on file  Occupational History  . Occupation: CNA  Social Needs  . Financial resource strain: Not on file  . Food  insecurity:    Worry: Not on file    Inability: Not on file  . Transportation needs:    Medical: Not on file    Non-medical: Not on file  Tobacco Use  . Smoking status: Former Smoker    Packs/day: 0.50    Years: 10.00    Pack years: 5.00    Types: Cigarettes    Last attempt to quit: 10/16/2008    Years since quitting: 9.2  . Smokeless tobacco: Never Used  Substance and Sexual Activity  . Alcohol use: No  . Drug use: No  . Sexual activity: Not on file  Lifestyle  . Physical activity:    Days per week: Not on file    Minutes per session: Not on file  . Stress: Not on file  Relationships  . Social connections:    Talks on phone: Not on file    Gets together: Not on file    Attends religious service: Not on file    Active member of club or organization: Not on file    Attends meetings of clubs or organizations: Not on file    Relationship status: Not on file  . Intimate partner violence:    Fear of current or ex partner: Not on file    Emotionally abused: Not on file    Physically abused: Not on file    Forced sexual activity: Not on file  Other Topics Concern  .  Not on file  Social History Narrative  . Not on file     Allergies  Allergen Reactions  . Sulfonamide Derivatives Other (See Comments)    REACTION: unknown     Outpatient Medications Prior to Visit  Medication Sig Dispense Refill  . Cholecalciferol (VITAMIN D3) 1000 units CAPS Take 1 capsule by mouth daily.    . Multiple Vitamin (THERA) TABS Take 1 tablet by mouth daily.     No facility-administered medications prior to visit.      Review of Systems  Constitutional: Negative for activity change, appetite change, chills, diaphoresis, fatigue and fever.  HENT: Negative for congestion, ear discharge, ear pain, facial swelling, hearing loss, mouth sores, nosebleeds, postnasal drip, rhinorrhea, sinus pressure, sinus pain, sneezing, sore throat, trouble swallowing and voice change.   Eyes: Negative.     Respiratory: Negative for cough, choking, chest tightness, shortness of breath and wheezing.   Cardiovascular: Negative for chest pain, palpitations and leg swelling.  Gastrointestinal: Negative for abdominal pain, diarrhea, nausea and vomiting.  Endocrine: Negative.   Genitourinary: Negative.   Musculoskeletal: Negative.   Skin: Negative.  Negative for rash.  Neurological: Positive for dizziness. Negative for tremors, seizures, syncope, speech difficulty, weakness, numbness and headaches.  Hematological: Positive for adenopathy. Does not bruise/bleed easily.  Psychiatric/Behavioral: Negative.        Objective:   Physical Exam  Vitals:   12/31/17 1118  BP: 106/74  Pulse: 86  Resp: 18  Temp: 98.2 F (36.8 C)  TempSrc: Oral  SpO2: 97%  Weight: 173 lb (78.5 kg)  Height: 5\' 4"  (1.626 m)    Gen: Pleasant, well-nourished, in no distress,  normal affect  ENT: No lesions,  mouth clear,  oropharynx clear, no postnasal drip  Neck: No JVD, no TMG, no carotid bruits  Lymphatics: There is a very small tiny pea-sized lymph nodes that are movable in the left anterior cervical chain the submandibular and some axillary nodes are not enlarged the axillary lymph nodes and femoral lymph nodes are not enlarged no other lymph nodes are palpated  Lungs: No use of accessory muscles, no dullness to percussion, clear without rales or rhonchi  Cardiovascular: RRR, heart sounds normal, no murmur or gallops, no peripheral edema  Abdomen: soft and NT, no HSM,  BS normal  Musculoskeletal: No deformities, no cyanosis or clubbing  Neuro: alert, non focal  Skin: Warm, no lesions or rashes, no sign of bruising or petechiae  No results found. Lab Results  Component Value Date   WBC 2.8 (L) 12/24/2017   WBC 2.7 (L) 12/24/2017   HGB 13.0 12/24/2017   HGB 13.3 12/24/2017   HCT 42.9 12/24/2017   HCT 41.5 12/24/2017   MCV 75 (L) 12/24/2017   MCV 76 (L) 12/24/2017   PLT 221 12/24/2017   PLT 214  12/24/2017   CXR 2016:   IMPRESSION: Residual bilateral coarse upper lobe predominant interstitial opacities are somewhat improved from recent prior exam. Findings are compatible with sarcoidosis.  CXR 10/19:  No change, no acute process   PFTs 2016: Completely normal ACE level June 2011 100   BMP Latest Ref Rng & Units 12/09/2017 04/26/2012 05/16/2011  Glucose 70 - 99 mg/dL 161(W) 98 89  BUN 6 - 20 mg/dL 14 18 13   Creatinine 0.44 - 1.00 mg/dL 9.60(A) 5.40(J) 8.11  Sodium 135 - 145 mmol/L 138 130(L) 134(L)  Potassium 3.5 - 5.1 mmol/L 4.5 3.7 3.5  Chloride 98 - 111 mmol/L 105 93(L) 98  CO2 22 -  32 mmol/L 25 27 28   Calcium 8.9 - 10.3 mg/dL 9.6 16.1 9.9   Pathologist review of peripheral smear shows erythrocytosis with polycythemia features with numerous small erythrocytes that are nucleated     Assessment & Plan:  I personally reviewed all images and lab data in the [facility] system as well as any outside material available during this office visit and agree with the  radiology impressions.   Low mean corpuscular volume (MCV) The patient appears to have an erythrocytosis process with small red cells  many of which are nucleated While her mean corpuscular volume is low her overall hematocrit and hemoglobin are normal I do not believe there is a relationship between the small lymph nodes in the cervical chain and this finding on the CBC The white count is relatively low but all the white cell lines appear to be normal on the complete blood count Plan Would recommend at least a one-time visit with hematology to review current hematology status A referral to hematology was made  Lymphadenopathy of left cervical region This appears to be a benign process but will ask hematology to comment  Leukopenia Normal white cell line seen however relatively low white cell count still noted  PULMONARY SARCOIDOSIS Pulmonary sarcoidosis is stable at this time note ACE level is 40 and is reduced chest  x-ray also is unchanged compared to previous   Haruka was seen today for follow-up.  Diagnoses and all orders for this visit:  Low mean corpuscular volume (MCV) -     Ambulatory referral to Hematology  Lymphadenopathy of left cervical region -     Ambulatory referral to Hematology  Leukopenia, unspecified type -     Ambulatory referral to Hematology  PULMONARY SARCOIDOSIS

## 2017-12-31 NOTE — Assessment & Plan Note (Signed)
This appears to be a benign process but will ask hematology to comment

## 2017-12-31 NOTE — Assessment & Plan Note (Signed)
Normal white cell line seen however relatively low white cell count still noted

## 2017-12-31 NOTE — Assessment & Plan Note (Signed)
The patient appears to have an erythrocytosis process with small red cells  many of which are nucleated While her mean corpuscular volume is low her overall hematocrit and hemoglobin are normal I do not believe there is a relationship between the small lymph nodes in the cervical chain and this finding on the CBC The white count is relatively low but all the white cell lines appear to be normal on the complete blood count Plan Would recommend at least a one-time visit with hematology to review current hematology status A referral to hematology was made

## 2017-12-31 NOTE — Patient Instructions (Signed)
No change in medications A referral to medical hematology will be made Dr Mancel Bale, this is not urgent, it can be scheduled Please make an appointment with [place] to establish for primary care  Return to Dr Delford Field is as needed

## 2018-01-06 ENCOUNTER — Telehealth: Payer: Self-pay | Admitting: Hematology and Oncology

## 2018-01-06 ENCOUNTER — Encounter: Payer: Self-pay | Admitting: Hematology and Oncology

## 2018-01-06 NOTE — Telephone Encounter (Signed)
New referral received from Dr. Delford Field from [facility] for Eval Low MCV, Target cells, erythrocytosis. Low WBC. Pt returned my call to schedule an appointment. Appt has been scheduled for the pt to see Dr. Caron Presume on 10/29 at 11am. Pt aware to arrive 30 minutes early. Letter mailed.

## 2018-01-11 ENCOUNTER — Other Ambulatory Visit: Payer: Self-pay | Admitting: Hematology and Oncology

## 2018-01-11 DIAGNOSIS — R590 Localized enlarged lymph nodes: Secondary | ICD-10-CM

## 2018-01-11 DIAGNOSIS — D869 Sarcoidosis, unspecified: Secondary | ICD-10-CM

## 2018-01-11 DIAGNOSIS — D72819 Decreased white blood cell count, unspecified: Secondary | ICD-10-CM

## 2018-01-11 NOTE — Progress Notes (Signed)
[facility] Initial Consultation  Patient Name:  Kara Atkinson  DOB: [birth year]   Date of Service: January 13, 2018  Referring Provider: Asencion Noble, MD [address], [place] [address]   Consulting Physician: Henreitta Leber, MD Hematology/Oncology  Reason for Referral: In the setting of low red blood cell indices without anemia or thrombocytopenia, she presents now for further diagnostic and therapeutic recommendations.  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  Her primary care physician is Dr. Asencion Noble.  She is accompanied by her attentive husband [name].  On December 09, 2017 she was seen and evaluated at [facility] for concern of lymphadenopathy within the left neck associated with intermittent dizziness.  Small palpable lymph nodes were felt which by her account were present for nearly 3 months.  They were non-painful and had not changed significantly in size.  The dizziness that she experienced was positional and intermittent.  There was some minimal disequilibrium for which she recovered quickly.  She had no headaches, blurry vision, nausea, vomiting, chest or abdominal pain, or weight loss.  A routine complete blood count on September 24 revealed hemoglobin 12.8 hematocrit 39.9 RBC 5.32 MCV 75.0 MCH 24.1 RDW 14.4 WBC 2.6 with 45% neutrophils 41% lymphocytes 10% monocytes 3% eosinophils 1% basophil; platelets 151,000.  A review of her complete blood count from 6 years earlier showed hemoglobin 12.2 hematocrit 37.5 RBC 5.35 MCV 70.1 MCH 22.8 RDW 15.6 WBC 3.1; platelets 230,000.  She is not a vegetarian.  She has no known blood disorder  or bleeding tendency in her immediate family.  She is a gravida 2 para 2.  She menstruates on a fairly regular basis, but her last menses was on August 24.  It is not particularly heavy.  She has been on various forms of contraception including Depo-Provera from the age of 37 to 34 years.  She was on a contraceptive implant for 3 years, only recently removed in September.  She has not been on oral contraceptives.  She denies any bleeding tendency or easy bruisability.  She has no diabetes mellitus, primary hypertension, coronary artery disease.  She has no cardiac dysrhythmia.  She denies seizure disorder or stroke syndrome.  She has no thyroid disease.  She denies gastroesophageal reflux or peptic ulcer disease.  She has no viral hepatitis, inflammatory bowel disease, or diverticulosis.  In 2012 she had a colonoscopy for a bleeding hemorrhoid.  She has no rheumatoid or gouty arthritis.  She denies peripheral arterial or venous thromboembolic disease.    Both her appetite and weight have remained stable. She reports no unusual headache, syncope, near syncopal episodes.  She has positional dizziness.  Her overall energy level is consistent with her usual baseline.  There is no rash or itching.  She has no visual changes or hearing deficit.  She has no epistaxis or hemoptysis.  There is no significant cough, sore throat, or orthopnea.  She complains of mild dyspnea on exertion.  She has no chest or abdominal pain. There is no pain or difficulty in swallowing.  No fever, shaking chills, sweats, or flulike symptoms are reported. She has no heartburn or indigestion. She denies nausea, vomiting, diarrhea, constipation.  She moves her bowels 1-2 times  daily.  There is no melena or bright red blood per rectum.  No urinary frequency, urgency, hematuria, or dysuria are reported.  She has no swelling of her ankles.  She denies arthralgias or myalgias.  There is no numbness or tingling in the fingers or toes.  It is with  this background she presents with leukopenia/neutropenia without anemia or thrombocytosis/thrombocytopenia with low red blood cell indices of unclear etiology.  Past Medical History:  Diagnosis Date  . Glucose-6-phosphate dehydrogenase deficiency   . Kidney stone   . Pulmonary sarcoidosis [facility])    Past Surgical History:  Procedure Laterality Date  . cystine stone removal     Gynecologic History: Her menarche was at age 14 years. Her last menstruation was on November 08, 2017 She is not currently on contraception. She had been on Depo-Provera from ages 37 to 19 years. A hormonal implant was removed from her arm 2 months earlier in September. She is a gravida 2 para 2. Her first pregnancy was at age 37 years. Her last pregnancy was at age 37 years. She has never had a screening mammogram. She has not had a breast biopsy. Her last Pap smear was 2 months earlier, reportedly normal.  Family History  Problem Relation Age of Onset  . Sarcoidosis Unknown        cousin  Mother: Age 49 years: HTN Father: Age 2 years: No medical problems She has 1 brother without medical problems She has no sisters  Social History   Socioeconomic History  . Marital status: Married    Spouse name: Not on file  . Number of children: Not on file  . Years of education: Not on file  . Highest education level: Not on file  Occupational History  . Occupation: CNA  Social Needs  . Financial resource strain: Not on file  . Food insecurity:    Worry: Not on file    Inability: Not on file  . Transportation needs:    Medical: Not on file    Non-medical: Not on file  Tobacco Use  . Smoking status: Former Smoker    Packs/day: 0.50    Years: 10.00    Pack years: 5.00    Types: Cigarettes    Last attempt to quit: 10/16/2008    Years since quitting: 9.2  . Smokeless tobacco: Never Used  Substance and Sexual Activity  . Alcohol use: No  . Drug use: No  . Sexual activity: Not on file  Lifestyle  .  Physical activity:    Days per week: Not on file    Minutes per session: Not on file  . Stress: Not on file  Relationships  . Social connections:    Talks on phone: Not on file    Gets together: Not on file    Attends religious service: Not on file    Active member of club or organization: Not on file    Attends meetings of clubs or organizations: Not on file    Relationship status: Not on file  . Intimate partner violence:    Fear of current or ex partner: Not on file    Emotionally abused: Not on file    Physically abused: Not on file    Forced sexual activity: Not on file  Other Topics Concern  . Not on file  Social History Narrative  . Not on file  Kara Atkinson is married for the past 13 years. She has 2 healthy children. She began smoking at the age of  17 years. She stopped smoking at the age of 33 years. She smoked a maximum 1/2 pack of cigarettes daily. Her alcohol intake is very infrequent, never heavy. She reports no recreational drug use.  Transfusion History: No prior transfusion  Exposure History: She has no known exposure to toxic chemicals, radiation, or pesticides.  Allergies  Allergen Reactions  . Sulfonamide Derivatives Other (See Comments)    REACTION: unknown  She has no food or seasonal allergies  Current Outpatient Medications on File Prior to Visit  Medication Sig  . Cholecalciferol (VITAMIN D3) 1000 units CAPS Take 1 capsule by mouth daily.  . Multiple Vitamin (THERA) TABS Take 1 tablet by mouth daily.   No current facility-administered medications on file prior to visit.     Review of Systems: Constitutional: No fever, sweats, or shaking chills.  No appetite or weight deficit. Skin: No rash, scaling, sores, lumps, or jaundice. HEENT: No visual changes or hearing deficit. Pulmonary: No unusual cough, sore throat, or orthopnea; pulmonary sarcoid, now quiescent. Breasts: No complaints. Cardiovascular: No coronary artery disease, angina, or  myocardial infarction.  No cardiac dysrhythmia, essential hypertension, or dyslipidemia. Gastrointestinal: No indigestion, dysphagia, abdominal pain, diarrhea, or constipation.  No change in bowel habits.  No nausea or vomiting.  No melena or bright red blood per rectum. Genitourinary: No urinary frequency, urgency, hematuria, or dysuria. Musculoskeletal: No arthralgias or myalgias; no joint swelling, pain, or instability. Hematologic: No bleeding tendency or easy bruisability. Endocrine: No intolerance to heat or cold; no thyroid disease or diabetes mellitus. Vascular: No peripheral arterial or venous thromboembolic disease. Psychological: No anxiety, depression, or mood changes; no mental health illnesses. Neurological: Positional dizziness and mild disequilibrium on an intermittent basis.  No lightheadedness, syncope, or near syncopal episodes; no numbness or tingling in the fingers or toes.  Physical Examination: Vital Signs: Body surface area is 1.89 meters squared.  Vitals:   01/13/18 1142  BP: 100/75  Pulse: 65  Resp: 18  Temp: 98.5 F (36.9 C)  SpO2: 100%    Filed Weights   01/13/18 1142  Weight: 173 lb 14.4 oz (78.9 kg)  ECOG PERFORMANCE STATUS: 0 Constitutional:  Kara Atkinson is a fully nourished and developed African-American. She looks age appropriate. She is friendly and cooperative without respiratory compromise at rest. Skin: No rashes, scaling, dryness, jaundice, or itching. HEENT: Head is normocephalic and atraumatic.  Pupils are equal round and reactive to light and accommodation.  Sclerae are anicteric.  Conjunctivae are pink.  No sinus tenderness nor oropharyngeal lesions.  Lips without cracking or peeling; tongue without mass, inflammation, or nodularity.  Mucous membranes are moist. Neck: Supple and symmetric. No jugular venous distention or thyromegaly.  Trachea is midline. Lymphatics: Small fleshy subcentimeter left anterior cervical lymph nodes (2).  No  supraclavicular lymphadenopathy.  No epitrochlear, axillary, or inguinal lymphadenopathy is appreciated. Respiratory/chest: Thorax is symmetrical.  Breath sounds are clear to auscultation and percussion.  Normal excursion and respiratory effort. Back: Symmetric without deformity or tenderness. Cardiovascular: Heart rate and rhythm are regular without murmurs, gallops, or rubs. Gastrointestinal: Abdomen is soft, nontender; no organomegaly.  Bowel sounds are normoactive.  No masses are appreciated. Extremities: In the lower extremities, there is no asymmetric swelling, erythema, tenderness, or cord formation.  No clubbing, cyanosis, nor edema. Hematologic: No petechiae, hematomas, or ecchymoses. Psychological:  She is oriented to person, place, and time; normal affect, memory, and cognition. Neurological: There are no gross neurologic deficits.  Laboratory Results: I have reviewed the data as  listed: January 13, 2018  Ref Range & Units 13:13 (01/13/18) 2wk ago (12/24/17) 2wk ago (12/24/17)  WBC Count 4.0 - 10.5 K/uL 3.4Low   2.7Low  R 2.8Low  R  RBC 3.87 - 5.11 MIL/uL 5.29High   5.45High  R 5.74High  R  Hemoglobin 12.0 - 15.0 g/dL 12.5  13.3 R 13.0 R  HCT 36.0 - 46.0 % 39.9  41.5 R 42.9 R  MCV 80.0 - 100.0 fL 75.4Low   76Low  R 75Low  R  MCH 26.0 - 34.0 pg 23.6Low   24.4Low  R 22.6Low  R  MCHC 30.0 - 36.0 g/dL 31.3  32.0 R 30.3Low  R  RDW 11.5 - 15.5 % 14.1  15.5High  R 14.4 R  Platelet Count 150 - 400 K/uL 191  214 R 221 R  nRBC 0.0 - 0.2 % 0.0     Neutrophils Relative % % 55  54 R 50 R  Neutro Abs 1.7 - 7.7 K/uL 1.9  1.4 R 1.4 R  Lymphocytes Relative % 32     Lymphs Abs 0.7 - 4.0 K/uL 1.1  1.0 R 1.0 R  Monocytes Relative % 10     Monocytes Absolute 0.1 - 1.0 K/uL 0.4     Eosinophils Relative % 2     Eosinophils Absolute 0.0 - 0.5 K/uL 0.1     Basophils Relative % 1     Basophils Absolute 0.0 - 0.1 K/uL 0.0  0.0 R 0.0 R  Immature Granulocytes % 0  0 R 0 R  Abs Immature Granulocytes  0.00 - 0.07 K/uL 0.00       Ref Range & Units 13:13  Retic Ct Pct 0.4 - 3.1 % 1.7   RBC. 3.87 - 5.11 MIL/uL 5.29High    Retic Count, Absolute 19.0 - 186.0 K/uL 91.0   Immature Retic Fract 2.3 - 15.9 % 5.9   Reticulocyte Hemoglobin >27.9 pg 27.2Low    Comment:     A RET-He < 28 pg is an indication  of iron-deficient or iron-  insufficient erythropoiesis.  Patients with thalassemia may also  have a decreased RET-He result  unrelated to iron availability.     If this patient has chronic  kidney disease and does not have  a hemoglobinopathy he/she meets  criteria for iron deficiency  per the 2016 NICE guidelines.  Refer to specific guidelines to  determine the appropriate  thresholds for treating CKD-  associated iron deficiency. TSAT  and ferritin should be used in  patients with hemoglobinopathies  (e.g. thalassemia).    CMP Latest Ref Rng & Units 01/13/2018 12/09/2017 04/26/2012  Glucose 70 - 99 mg/dL 75 101(H) 98  BUN 6 - 20 mg/dL _0 Creatinine 0.44 - 1.00 mg/dL 1.19(H) 1.03(H) 1.31(H)  Sodium 135 - 145 mmol/L 138 138 130(L)  Potassium 3.5 - 5.1 mmol/L 4.1 4.5 3.7  Chloride 98 - 111 mmol/L 103 105 93(L)  CO2 22 - 32 mmol/L _1 Calcium 8.9 - 10.3 mg/dL 10.1 9.6 10.0  Total Protein 6.5 - 8.1 g/dL 8.6(H) 8.6(H) 9.2(H)  Total Bilirubin 0.3 - 1.2 mg/dL 1.7(H) 1.0 0.5  Alkaline Phos 38 - 126 U/L 43 32(L) 104  AST 15 - 41 U/L _2 ALT 0 - 44 U/L _3 LDH 123 DAT negative Ferritin 54 Iron/TIBC 113/344 Iron saturation 33%  Diagnostic/Imaging Studies: CHEST - 2 VIEW  COMPARISON:  01/09/2015  FINDINGS: Sarcoidosis with biapical bandlike opacities  and hilar retraction. No progression from prior. Normal heart size. No consolidation, effusion, or any edema. Branching calculus over the right kidney, notably progressed from 2013. Small cervical ribs.  IMPRESSION: 1. Stable parenchymal changes of sarcoidosis. 2. Large, branching right renal  calculus that has significantly progressed from 2013. Recommend urology referral.  Monte Fantasia M.D. 12/24/2017 16:13  Summary/Assessment: In the setting of low red blood cell indices without anemia or thrombocytopenia, she presents now for further diagnostic and therapeutic recommendations.  On December 09, 2017 she was seen and evaluated at [facility] emergency department for concern of lymphadenopathy within the left neck associated with intermittent dizziness.  Small palpable lymph nodes were felt especially in the left neck which by her account were present for nearly 3 months. They were non-painful and had not changed significantly in size. The dizziness that she experienced was positional and intermittent. There was some minimal disequilibrium for which she recovered quickly.  She had no headaches, blurry vision, nausea, vomiting, fever, sweats, shaking chills chest or abdominal pain, or weight loss.    A routine complete blood count on September 24 revealed hemoglobin 12.8 hematocrit 39.9 RBC 5.32 MCV 75.0 MCH 24.1 RDW 14.4 WBC 2.6 with 45% neutrophils 41% lymphocytes 10% monocytes 3% eosinophils 1% basophil; platelets 151,000.  A review of her complete blood count from 6 years earlier showed hemoglobin 12.2 hematocrit 37.5 RBC 5.35 MCV 70.1 MCH 22.8 RDW 15.6 WBC 3.1; platelets 230,000.  She is not a vegetarian.  She has no known blood disorder or bleeding tendency in her immediate family.  She is a gravida 2 para 2.  She menstruates on a fairly regular basis, but her last menses was on August 24.  It is not particularly heavy.  She has been on various forms of contraception including Depo-Provera from the age of 75 to 7 years.  She was on a contraceptive/hormonal implant for 3 years, only recently removed in September.  She has not been on oral contraceptives. She denies any bleeding tendency or easy bruisability.  She has no diabetes mellitus, primary hypertension,  coronary artery disease.  She has no cardiac dysrhythmia.  She denies seizure disorder or stroke syndrome.  She has no thyroid disease.  She denies gastroesophageal reflux or peptic ulcer disease.  She has no viral hepatitis, inflammatory bowel disease, or diverticulosis.  In 2012 she had a colonoscopy for a bleeding hemorrhoid.  She has no rheumatoid or gouty arthritis.  She denies peripheral arterial or venous thromboembolic disease.    Both her appetite and weight have remained stable. She reports no unusual headache, syncope, near syncopal episodes.  She has positional dizziness.  Her overall energy level is consistent with her usual baseline.  There is no rash or itching.  She has no visual changes or hearing deficit.  She has no epistaxis or hemoptysis.  There is no significant cough, sore throat, or orthopnea.  She complains of mild dyspnea on exertion.  She has no chest or abdominal pain. There is no pain or difficulty in swallowing.  No fever, shaking chills, sweats, or flulike symptoms are reported. She has no heartburn or indigestion. She denies nausea, vomiting, diarrhea, constipation.  She moves her bowels 1-2 times daily.  There is no melena or bright red blood per rectum.  No urinary frequency, urgency, hematuria, or dysuria are reported.  She has no swelling of her ankles.  She denies arthralgias or myalgias. There is no numbness or tingling in the fingers  or toes.  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   Recommendation/Plan: I had a lengthy discussion with both Kara Atkinson and [name] regarding her most recent laboratory studies.  The results of her laboratory studies from today were not available at the time of discharge.  Some of those results are detailed above.  The preliminary results suggest iron deficiency without anemia.  She is not iron depleted.  She may have been taking  over-the-counter iron for a brief period. That might account for her pattern of iron studies.  The reticulocyte panel is a newer and very accurate way of identifying iron deficiency.  It is highly suggestive of iron deficiency. Those results are detailed above.    Her leukopenia however is not explainable on the basis of iron deficiency.  That is very uncommon.  Laboratory studies were obtained to identify hemolysis possibly secondary to G6PD deficiency or an underlying hemoglobinopathy.  The comprehensive metabolic panel shows a small but rising serum creatinine and total protein.  5 years earlier her total protein was 9.2.  Today's total protein is 8.6.  In addition her total bilirubin was 1.7.  This is a new finding.  Unfortunately the total bilirubin was not fractionated.  Barring any unforeseen complications, her next scheduled doctor visit at her request is on November 12.  She will be out of town briefly. She will be advised over the telephone not to take any supplements either multivitamins or iron by mouth.    At the time of her next visit, laboratory studies will be requested to include a repeat total and direct bilirubin, serum protein electrophoresis, immunofixation, and free kappa/lambda light chain assay given her elevated total protein and worsening renal function, vitamin C94, folic acid, copper level, hepatitis C antibody, coagulation testing, and CRP.   It is recommended that she have better imaging of her renal calculi and consider urologic surgical intervention. This will be discussed with her over the telephone prior to her next visit.  She was advised to call us in the interim should any new or untoward problems arise.  The total time spent discussing her previous laboratory studies, the importance and rationale for a systematic approach to mild leukopenia without anemia or thrombocytopenia, the importance of explaining her low red blood cell indices, preliminary consideration and  recommendations was 60 minutes. At least 50% of that time was spent in face-to-face discussion, reviewing her records, laboratory studies, counseling, and answering questions. All questions were answered to her satisfaction.   This note was dictated using voice activated technology/software.  Unfortunately, typographical errors are not uncommon, and transcription is subject to mistakes and regrettably misinterpretation.  If necessary, clarification of the above information can be discussed with me at any time.  Thank you Dr. Joya Gaskins for allowing my participation in the care of [facility]. I will keep you closely informed as the results of her preliminary laboratory data become available.  Please do not hesitate to call should any questions arise regarding this initial consultation and discussion.  FOLLOW UP: AS DIRECTED   cc:      Asencion Noble, MD   Henreitta Leber, MD  Hematology/Oncology [place] [address] [place],  [address] Office: [phone] YYTK: [phone]

## 2018-01-11 NOTE — Progress Notes (Unsigned)
[facility] Outpatient Hematology/Oncology Initial Consultation  Patient Name:  Kara Atkinson  DOB: February 25, 1981   Date of Service: January 13, 2018  Referring Provider: Shan Levans, MD  Consulting Physician: Toni Arthurs, MD Hematology/Oncology    Reason for Referral:   History Present Illness: Kara Atkinson    Past Medical History:  Diagnosis Date  . Glucose-6-phosphate dehydrogenase deficiency   . Kidney stone   . 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 cystine stone removal      Gynecologic History:    Family History  Problem Relation Age of Onset  . Sarcoidosis Unknown        cousin     Social History   Socioeconomic History  . Marital status: Married    Spouse name: Not on file  . Number of children: Not on file  . Years of education: Not on file  . Highest education level: Not on file  Occupational History  . Occupation: CNA  Social Needs  . Financial resource strain: Not on file  . Food insecurity:    Worry: Not on file    Inability: Not on file  . Transportation needs:    Medical: Not on file    Non-medical: Not on file  Tobacco Use  . Smoking status: Former Smoker    Packs/day: 0.50    Years: 10.00    Pack years: 5.00    Types: Cigarettes    Last attempt to quit: 10/16/2008    Years since quitting: 9.2  . Smokeless tobacco: Never Used  Substance and Sexual Activity  . Alcohol use: No  . Drug use: No  . Sexual activity: Not on file  Lifestyle  . Physical activity:    Days per week: Not on file    Minutes per session: Not on file  . Stress: Not on file  Relationships  . Social connections:    Talks on phone: Not on file    Gets together: Not on file    Attends religious service: Not on file    Active member of club or organization: Not on file    Attends meetings of clubs or organizations: Not on file    Relationship status: Not on file   . Intimate partner violence:    Fear of current or ex partner: Not on file    Emotionally abused: Not on file    Physically abused: Not on file    Forced sexual activity: Not on file  Other Topics Concern  . Not on file  Social History Narrative  . Not on file    Transfusion History: No prior transfusion  Exposure History:   Allergies:  Allergies  Allergen Reactions  . Sulfonamide Derivatives Other (See Comments)    REACTION: unknown    Current Medications: Current Outpatient Medications on File Prior to Visit  Medication Sig  . Cholecalciferol (VITAMIN D3) 1000 units CAPS Take 1 capsule by mouth daily.  . Multiple Vitamin (THERA) TABS Take 1 tablet by mouth daily.   No current facility-administered medications on file prior to visit.      Review of Systems: Constitutional: No fever, sweats, or shaking chills.  No appetite or weight deficit. Skin: No rash, scaling, sores, lumps, or jaundice. HEENT: No visual changes or hearing deficit. Pulmonary: No unusual cough, sore throat, or orthopnea. Breasts: No complaints. Cardiovascular: No coronary artery disease, angina, or myocardial infarction.  No cardiac dysrhythmia, essential hypertension,  or dyslipidemia. Gastrointestinal: No indigestion, dysphagia, abdominal pain, diarrhea, or constipation.  No change in bowel habits. Genitourinary: No frequency, urgency, hematuria, or dysuria. Musculoskeletal: No arthralgias or myalgias; no joint swelling, pain, or instability. Hematologic: No bleeding tendency or easy bruisability. Endocrine: No intolerance to heat or cold; no thyroid disease or diabetes mellitus. Vascular: No peripheral arterial or venous thromboembolic disease. Psychological: No anxiety, depression, or mood changes; no mental health illnesses. Neurological: No dizziness, lightheadedness, syncope, or near syncopal episodes; no numbness or tingling in the fingers or toes.  Physical Examination: Vital Signs:  There is no height or weight on file to calculate BSA. There were no vitals filed for this visit. There were no vitals filed for this visit. ECOG PERFORMANCE STATUS: {CHL ONC ECOG ZO:[id number]} Constitutional:  Kara Atkinson is fully nourished and developed.  He/She looks age appropriate.  He/She is friendly and cooperative without respiratory compromise at rest. Skin: No rashes, scaling, dryness, jaundice, or itching. HEENT: Head is normocephalic and atraumatic.  Pupils are equal round and reactive to light and accommodation.  Sclerae are anicteric.  Conjunctivae are pink.  No sinus tenderness nor oropharyngeal lesions.  Lips without cracking or peeling; tongue without mass, inflammation, or nodularity.  Mucous membranes are moist. Neck: Supple and symmetric.  No jugular venous distention or thyromegaly.  Trachea is midline. Lymphatics: No cervical or supraclavicular lymphadenopathy.  No epitrochlear, axillary, or inguinal lymphadenopathy is appreciated. Breasts: No mass, discharge, dimpling, or retraction. Respiratory/chest: Thorax is symmetrical.  Breath sounds are clear to auscultation and percussion.  Normal excursion and respiratory effort. Back: Symmetric without deformity or tenderness. Cardiovascular: Heart rate and rhythm are regular without murmurs, gallops, or rubs. Gastrointestinal: Abdomen is soft, nontender; no organomegaly.  Bowel sounds are normoactive.  No masses are appreciated. Genitourinary: Normal external female/female genitalia. Rectal examination: Not performed. Extremities: In the lower extremities, there is no asymmetric swelling, erythema, tenderness, or cord formation.  No clubbing, cyanosis, nor edema. Hematologic: No petechiae, hematomas, or ecchymoses. Psychological:  He/She is oriented to person, place, and time; normal affect, memory, and cognition. Neurological: There are no gross neurologic deficits.  Laboratory Results: I have reviewed the data as listed: CBC  Latest Ref Rng & Units 12/24/2017 12/24/2017 12/09/2017  WBC 3.4 - 10.8 x10E3/uL 2.7(L) 2.8(L) 2.6(L)  Hemoglobin 11.1 - 15.9 g/dL 81.1 91.4 78.2  Hematocrit 34.0 - 46.6 % 41.5 42.9 39.9  Platelets 150 - 450 x10E3/uL 214 221 151    CMP Latest Ref Rng & Units 12/09/2017 04/26/2012 05/16/2011  Glucose 70 - 99 mg/dL 956(O) 98 89  BUN 6 - 20 mg/dL 14 18 13   Creatinine 0.44 - 1.00 mg/dL 1.30(Q) 6.57(Q) 4.69  Sodium 135 - 145 mmol/L 138 130(L) 134(L)  Potassium 3.5 - 5.1 mmol/L 4.5 3.7 3.5  Chloride 98 - 111 mmol/L 105 93(L) 98  CO2 22 - 32 mmol/L 25 27 28   Calcium 8.9 - 10.3 mg/dL 9.6 62.9 9.9  Total Protein 6.5 - 8.1 g/dL 5.2(W) 4.1(L) 8.9(H)  Total Bilirubin 0.3 - 1.2 mg/dL 1.0 0.5 0.6  Alkaline Phos 38 - 126 U/L 32(L) 104 78  AST 15 - 41 U/L 22 24 20   ALT 0 - 44 U/L 14 14 14      Diagnostic/Imaging Studies:   Summary/Assessment:   Recommendation/Plan:   The total time spent discussing the XXX, methodology for evaluating XXX,  preliminary considerations with recommendations and plan was 60 minutes.  At least 50% of that time was spent in discussion, reviewing outside records,  laboratory evaluation, counseling, and answering questions. All questions were answered to her satisfaction. she knows to call the Clinic with any problems, questions, or concerns.  This note was dictated using voice activated technology/software.  Unfortunately, typographical errors are not uncommon, and transcription is subject to mistakes and regrettably misinterpretation.  If necessary, clarification of the above information can be discussed with me at any time.  Thank you Dr. Clayborne Artist for allowing my participation in the care of Kara Atkinson. I will keep you closely informed as the results of her preliminary laboratory data become available.  Please do not hesitate to call should any questions arise regarding this initial consultation and discussion.  FOLLOW UP: AS DIRECTED   cc:   Toni Arthurs, MD   Hematology/Oncology [facility] [address] [place], [place] [address] Office: [phone] Main: [phone]

## 2018-01-13 ENCOUNTER — Inpatient Hospital Stay: Payer: Medicaid Other

## 2018-01-13 ENCOUNTER — Encounter: Payer: Self-pay | Admitting: Hematology and Oncology

## 2018-01-13 ENCOUNTER — Inpatient Hospital Stay: Payer: Self-pay | Attending: Hematology and Oncology | Admitting: Hematology and Oncology

## 2018-01-13 VITALS — BP 100/75 | HR 65 | Temp 98.5°F | Resp 18 | Ht 64.0 in | Wt 173.9 lb

## 2018-01-13 DIAGNOSIS — R42 Dizziness and giddiness: Secondary | ICD-10-CM | POA: Insufficient documentation

## 2018-01-13 DIAGNOSIS — Z87891 Personal history of nicotine dependence: Secondary | ICD-10-CM | POA: Insufficient documentation

## 2018-01-13 DIAGNOSIS — R718 Other abnormality of red blood cells: Secondary | ICD-10-CM

## 2018-01-13 DIAGNOSIS — N200 Calculus of kidney: Secondary | ICD-10-CM | POA: Insufficient documentation

## 2018-01-13 DIAGNOSIS — D72819 Decreased white blood cell count, unspecified: Secondary | ICD-10-CM

## 2018-01-13 DIAGNOSIS — R590 Localized enlarged lymph nodes: Secondary | ICD-10-CM

## 2018-01-13 DIAGNOSIS — D860 Sarcoidosis of lung: Secondary | ICD-10-CM | POA: Insufficient documentation

## 2018-01-13 DIAGNOSIS — D550 Anemia due to glucose-6-phosphate dehydrogenase [G6PD] deficiency: Secondary | ICD-10-CM | POA: Insufficient documentation

## 2018-01-13 DIAGNOSIS — D869 Sarcoidosis, unspecified: Secondary | ICD-10-CM

## 2018-01-13 LAB — CBC WITH DIFFERENTIAL (CANCER CENTER ONLY)
Abs Immature Granulocytes: 0 10*3/uL (ref 0.00–0.07)
BASOS ABS: 0 10*3/uL (ref 0.0–0.1)
Basophils Relative: 1 %
EOS ABS: 0.1 10*3/uL (ref 0.0–0.5)
EOS PCT: 2 %
HEMATOCRIT: 39.9 % (ref 36.0–46.0)
Hemoglobin: 12.5 g/dL (ref 12.0–15.0)
Immature Granulocytes: 0 %
Lymphocytes Relative: 32 %
Lymphs Abs: 1.1 10*3/uL (ref 0.7–4.0)
MCH: 23.6 pg — ABNORMAL LOW (ref 26.0–34.0)
MCHC: 31.3 g/dL (ref 30.0–36.0)
MCV: 75.4 fL — ABNORMAL LOW (ref 80.0–100.0)
Monocytes Absolute: 0.4 10*3/uL (ref 0.1–1.0)
Monocytes Relative: 10 %
Neutro Abs: 1.9 10*3/uL (ref 1.7–7.7)
Neutrophils Relative %: 55 %
Platelet Count: 191 10*3/uL (ref 150–400)
RBC: 5.29 MIL/uL — AB (ref 3.87–5.11)
RDW: 14.1 % (ref 11.5–15.5)
WBC: 3.4 10*3/uL — AB (ref 4.0–10.5)
nRBC: 0 % (ref 0.0–0.2)

## 2018-01-13 LAB — FERRITIN: Ferritin: 54 ng/mL (ref 11–307)

## 2018-01-13 LAB — COMPREHENSIVE METABOLIC PANEL
ALT: 12 U/L (ref 0–44)
ANION GAP: 8 (ref 5–15)
AST: 20 U/L (ref 15–41)
Albumin: 4.2 g/dL (ref 3.5–5.0)
Alkaline Phosphatase: 43 U/L (ref 38–126)
BILIRUBIN TOTAL: 1.7 mg/dL — AB (ref 0.3–1.2)
BUN: 11 mg/dL (ref 6–20)
CO2: 27 mmol/L (ref 22–32)
Calcium: 10.1 mg/dL (ref 8.9–10.3)
Chloride: 103 mmol/L (ref 98–111)
Creatinine, Ser: 1.19 mg/dL — ABNORMAL HIGH (ref 0.44–1.00)
GFR calc Af Amer: >60 mL/min (ref >60)
GFR calc non Af Amer: 58 mL/min — ABNORMAL LOW (ref >60)
Glucose, Bld: 75 mg/dL (ref 70–99)
POTASSIUM: 4.1 mmol/L (ref 3.5–5.1)
SODIUM: 138 mmol/L (ref 135–145)
TOTAL PROTEIN: 8.6 g/dL — AB (ref 6.5–8.1)

## 2018-01-13 LAB — RETIC PANEL
IMMATURE RETIC FRACT: 5.9 % (ref 2.3–15.9)
RBC.: 5.29 MIL/uL — ABNORMAL HIGH (ref 3.87–5.11)
RETIC COUNT ABSOLUTE: 91 10*3/uL (ref 19.0–186.0)
RETIC CT PCT: 1.7 % (ref 0.4–3.1)
Reticulocyte Hemoglobin: 27.2 pg — ABNORMAL LOW (ref >27.9)

## 2018-01-13 LAB — IRON AND TIBC
Iron: 113 ug/dL (ref 41–142)
Saturation Ratios: 33 % (ref 21–57)
TIBC: 344 ug/dL (ref 236–444)
UIBC: 230 ug/dL (ref 120–384)

## 2018-01-13 LAB — TECHNOLOGIST SMEAR REVIEW

## 2018-01-13 LAB — DIRECT ANTIGLOBULIN TEST (NOT AT ARMC)
DAT, IgG: NEGATIVE
DAT, complement: NEGATIVE

## 2018-01-13 LAB — LACTATE DEHYDROGENASE: LDH: 123 U/L (ref 98–192)

## 2018-01-13 NOTE — Patient Instructions (Addendum)
We discussed in detail the results of your laboratory studies from September 24 which revealed a low white blood cell count and decreased MCV/MCH without anemia.  Your platelet count was normal.  Your white blood cell count was low even 6 years ago.  Laboratory studies for today were requested to include iron studies, hemoglobin analysis, and hemolysis evaluation in light of your G6PD deficiency by history.  Barring any unforeseen complications, your next scheduled doctor visit to discuss these results is on November 12.  Please do not hesitate to call if any questions arise in the interim.  Thank you! Debbe Odea, MD Hematology/Oncology

## 2018-01-14 LAB — BETA 2 MICROGLOBULIN, SERUM: BETA 2 MICROGLOBULIN: 1.6 mg/L (ref 0.6–2.4)

## 2018-01-14 LAB — HEMOGLOBINOPATHY EVALUATION
HGB S QUANTITAION: 0 %
Hgb A2 Quant: 2.1 % (ref 1.8–3.2)
Hgb A: 97.9 % (ref 96.4–98.8)
Hgb C: 0 %
Hgb F Quant: 0 % (ref 0.0–2.0)
Hgb Variant: 0 %

## 2018-01-14 LAB — HAPTOGLOBIN: Haptoglobin: 136 mg/dL (ref 34–200)

## 2018-01-27 ENCOUNTER — Telehealth: Payer: Self-pay | Admitting: Hematology and Oncology

## 2018-01-27 ENCOUNTER — Inpatient Hospital Stay: Payer: Self-pay | Attending: Hematology and Oncology | Admitting: Hematology and Oncology

## 2018-01-27 ENCOUNTER — Encounter: Payer: Self-pay | Admitting: Hematology and Oncology

## 2018-01-27 VITALS — BP 99/56 | HR 64 | Temp 97.5°F | Resp 18 | Ht 64.0 in | Wt 175.0 lb

## 2018-01-27 DIAGNOSIS — R599 Enlarged lymph nodes, unspecified: Secondary | ICD-10-CM | POA: Insufficient documentation

## 2018-01-27 DIAGNOSIS — Z87442 Personal history of urinary calculi: Secondary | ICD-10-CM | POA: Insufficient documentation

## 2018-01-27 DIAGNOSIS — R718 Other abnormality of red blood cells: Secondary | ICD-10-CM | POA: Insufficient documentation

## 2018-01-27 DIAGNOSIS — R0609 Other forms of dyspnea: Secondary | ICD-10-CM | POA: Insufficient documentation

## 2018-01-27 DIAGNOSIS — D72819 Decreased white blood cell count, unspecified: Secondary | ICD-10-CM | POA: Insufficient documentation

## 2018-01-27 DIAGNOSIS — R779 Abnormality of plasma protein, unspecified: Secondary | ICD-10-CM

## 2018-01-27 DIAGNOSIS — R42 Dizziness and giddiness: Secondary | ICD-10-CM | POA: Insufficient documentation

## 2018-01-27 DIAGNOSIS — E8809 Other disorders of plasma-protein metabolism, not elsewhere classified: Secondary | ICD-10-CM | POA: Insufficient documentation

## 2018-01-27 DIAGNOSIS — N200 Calculus of kidney: Secondary | ICD-10-CM | POA: Insufficient documentation

## 2018-01-27 DIAGNOSIS — D860 Sarcoidosis of lung: Secondary | ICD-10-CM | POA: Insufficient documentation

## 2018-01-27 NOTE — Telephone Encounter (Signed)
Scheduled appt per 11/12 los - pt is aware of appts - per patient request - no print out needed - my chart active.

## 2018-01-27 NOTE — Progress Notes (Signed)
Hematology/Oncology Outpatient Progress Note  Patient Name:  Kara Atkinson  DOB: [birth year]   Date of Service: January 27, 2018  Referring Provider: Asencion Noble, MD [address], [place] [address]   Consulting Physician: Henreitta Leber, MD Hematology/Oncology  Reason for Visit: In the setting of low red blood cell indices without anemia or thrombocytopenia, she presents now for the results of her preliminary laboratory studies and recommendations.  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  Her primary care physician is Dr. Asencion Noble.  She is accompanied by her attentive husband Kara Atkinson.  On December 09, 2017 she was seen and evaluated at [facility] for concern of lymphadenopathy within the left neck associated with intermittent dizziness.  Small palpable lymph nodes were felt which by her account were present for nearly 3 months.  They were non-painful and had not changed significantly in size.  The dizziness that she experienced was positional and intermittent.  There was some minimal disequilibrium for which she recovered quickly.  She had no headaches, blurry vision, nausea, vomiting, chest or abdominal pain, or weight loss.  A routine complete blood count on September 24 revealed hemoglobin 12.8 hematocrit 39.9 RBC 5.32 MCV 75.0 MCH 24.1 RDW 14.4 WBC 2.6 with 45% neutrophils 41% lymphocytes 10% monocytes 3% eosinophils 1% basophil; platelets 151,000.  A review of her complete blood count from 6 years earlier showed hemoglobin 12.2 hematocrit 37.5 RBC 5.35 MCV 70.1 MCH 22.8 RDW 15.6 WBC 3.1; platelets 230,000.  She is not a vegetarian.  She has no known blood disorder or bleeding tendency in  her immediate family.  She is a gravida 2 para 2.  She menstruates on a fairly regular basis, but her last menses was on October 31.  It lasted 5 days. It is not particularly heavy. She has been on various forms of contraception including Depo-Provera from the age of 88 to 58 years.  She was on a contraceptive implant for 3 years, only recently removed in September.  She has not been on oral contraceptives. Since the age of 35, when she discontinued oral contraception, her menstruation has been fairly regular on a monthly basis.  In 2012 she had a colonoscopy for a bleeding hemorrhoid. She denies any bleeding tendency or easy bruisability.  Prior to her first visit on October 29, she had been on oral iron for several months.  She stopped iron supplementation 2 weeks prior to that visit.  Her white blood cell count over the past month increased from 2.7 to 3.4.  Her absolute neutrophil count is 1900 cells.  Because of a low MCV and low MCH, iron deficiency was suspected. The results of her iron studies however are not convincing. Those results are detailed below. They are possibly confounded by recent oral iron intake. Her reticulocyte hemoglobin does suggest iron deficiency in the absence of thalassemia.  A hemoglobin electrophoresis was normal.  Alpha thalassemia however is not ruled out.  She has a history of G6PD deficiency. Her hemolytic parameters however, were negative.  She did not wish to be retested for G6PD.  In the past, her total bilirubin has been normal.  On October 29, the total bilirubin was 1.7. Unfortunately it was not fractionated to determine the direct or indirect component.  It is with this background she presents now without anemia or thrombocytosis/thrombocytopenia with low red blood cell indices as outlined above.  Interval History:  In the interim since her last visit, she reports no new problems or complaints.  Both her appetite and weight have remained stable. She reports no unusual  headache, syncope, near syncopal episodes.  She has positional dizziness.  Her overall energy level is consistent with her usual baseline.  There is no rash or itching.  She has no visual changes or hearing deficit.  She has no epistaxis or hemoptysis.  There is no significant cough, sore throat, or orthopnea.  She complains of mild dyspnea on exertion.  She has no chest or abdominal pain. There is no pain or difficulty in swallowing.  No fever, shaking chills, sweats, or flulike symptoms are reported. She has no heartburn or indigestion. She denies nausea, vomiting, diarrhea, constipation.  She moves her bowels 1-2 times daily.  There is no melena or bright red blood per rectum.  No urinary frequency, urgency, hematuria, or dysuria are reported.  She has no swelling of her ankles.  She denies arthralgias or myalgias.  There is no numbness or tingling in the fingers or toes.   Past Medical History Reviewed        Family History Reviewed       Social History Reviewed  Past Medical History:  Diagnosis Date  . Glucose-6-phosphate dehydrogenase deficiency   . Kidney stone   . Pulmonary sarcoidosis [facility])    Past Surgical History:  Procedure Laterality Date  . cystine stone removal     Allergies  Allergen Reactions  . Sulfonamide Derivatives Other (See Comments)    REACTION: unknown  She has no food or seasonal allergies  Current Outpatient Medications on File Prior to Visit  Medication Sig  . Cholecalciferol (VITAMIN D3) 1000 units CAPS Take 1 capsule by mouth daily.  . Multiple Vitamin (THERA) TABS Take 1 tablet by mouth daily.   No current facility-administered medications on file prior to visit.     Review of Systems: Constitutional: No fever, sweats, or shaking chills.  No appetite or weight deficit. Skin: No rash, scaling, sores, lumps, or jaundice. HEENT: No visual changes or hearing deficit. Pulmonary: No unusual cough, sore throat, or orthopnea; pulmonary sarcoid, now  quiescent. Breasts: No complaints. Cardiovascular: No coronary artery disease, angina, or myocardial infarction.  No cardiac dysrhythmia, essential hypertension, or dyslipidemia. Gastrointestinal: No indigestion, dysphagia, abdominal pain, diarrhea, or constipation.  No change in bowel habits.  No nausea or vomiting.  No melena or bright red blood per rectum. Genitourinary: No urinary frequency, urgency, hematuria, or dysuria. Musculoskeletal: No arthralgias or myalgias; no joint swelling, pain, or instability. Hematologic: No bleeding tendency or easy bruisability. Endocrine: No intolerance to heat or cold; no thyroid disease or diabetes mellitus. Vascular: No peripheral arterial or venous thromboembolic disease. Psychological: No anxiety, depression, or mood changes; no mental health illnesses. Neurological: Positional dizziness and mild disequilibrium on an intermittent basis.  No lightheadedness, syncope, or near syncopal episodes; no numbness or tingling in the fingers or toes.  Physical Examination: Vital Signs: Body surface area is 1.89 meters squared.  Vitals:   01/27/18 1033  BP: (!) 99/56  Pulse: 64  Resp: 18  Temp: (!) 97.5 F (36.4 C)  SpO2: 100%    Filed Weights   01/27/18 1033  Weight: 175 lb (79.4 kg)  ECOG PERFORMANCE STATUS: 0 Constitutional:  Kara Atkinson is a fully nourished and developed African-American.  She looks age appropriate. She is friendly and cooperative without respiratory compromise at rest. Skin: No rashes, scaling, dryness, jaundice, or itching. HEENT: Head is normocephalic and atraumatic.  Pupils are equal round and reactive to light and accommodation.  Sclerae are anicteric.  Conjunctivae are pink.  No sinus tenderness nor oropharyngeal lesions.  Lips without cracking or peeling; tongue without mass, inflammation, or nodularity.  Mucous membranes are moist. Neck: Supple and symmetric. No jugular venous distention or thyromegaly.  Trachea is  midline. Lymphatics: Small fleshy subcentimeter left anterior cervical lymph nodes (2).  No supraclavicular lymphadenopathy.  No epitrochlear, axillary, or inguinal lymphadenopathy is appreciated. Respiratory/chest: Thorax is symmetrical.  Breath sounds are clear to auscultation and percussion.  Normal excursion and respiratory effort. Back: Symmetric without deformity or tenderness. Cardiovascular: Heart rate and rhythm are regular without murmurs, gallops, or rubs. Gastrointestinal: Abdomen is soft, nontender; no organomegaly.  Bowel sounds are normoactive.  No masses are appreciated. Extremities: In the lower extremities, there is no asymmetric swelling, erythema, tenderness, or cord formation.  No clubbing, cyanosis, nor edema. Hematologic: No petechiae, hematomas, or ecchymoses. Psychological:  She is oriented to person, place, and time; normal affect, memory, and cognition. Neurological: There are no gross neurologic deficits.  Laboratory Results: I have reviewed the data as listed: January 13, 2018  Ref Range & Units 13:13 (01/13/18) 2wk ago (12/24/17) 2wk ago (12/24/17)  WBC Count 4.0 - 10.5 K/uL 3.4Low   2.7Low  R 2.8Low  R  RBC 3.87 - 5.11 MIL/uL 5.29High   5.45High  R 5.74High  R  Hemoglobin 12.0 - 15.0 g/dL 12.5  13.3 R 13.0 R  HCT 36.0 - 46.0 % 39.9  41.5 R 42.9 R  MCV 80.0 - 100.0 fL 75.4Low   76Low  R 75Low  R  MCH 26.0 - 34.0 pg 23.6Low   24.4Low  R 22.6Low  R  MCHC 30.0 - 36.0 g/dL 31.3  32.0 R 30.3Low  R  RDW 11.5 - 15.5 % 14.1  15.5High  R 14.4 R  Platelet Count 150 - 400 K/uL 191  214 R 221 R  nRBC 0.0 - 0.2 % 0.0     Neutrophils Relative % % 55  54 R 50 R  Neutro Abs 1.7 - 7.7 K/uL 1.9  1.4 R 1.4 R  Lymphocytes Relative % 32     Lymphs Abs 0.7 - 4.0 K/uL 1.1  1.0 R 1.0 R  Monocytes Relative % 10     Monocytes Absolute 0.1 - 1.0 K/uL 0.4     Eosinophils Relative % 2     Eosinophils Absolute 0.0 - 0.5 K/uL 0.1     Basophils Relative % 1     Basophils Absolute 0.0  - 0.1 K/uL 0.0  0.0 R 0.0 R  Immature Granulocytes % 0  0 R 0 R  Abs Immature Granulocytes 0.00 - 0.07 K/uL 0.00       Ref Range & Units 13:13  Retic Ct Pct 0.4 - 3.1 % 1.7   RBC. 3.87 - 5.11 MIL/uL 5.29High    Retic Count, Absolute 19.0 - 186.0 K/uL 91.0   Immature Retic Fract 2.3 - 15.9 % 5.9   Reticulocyte Hemoglobin >27.9 pg 27.2Low    Comment:     A RET-He < 28 pg is an indication  of iron-deficient or iron-  insufficient erythropoiesis.  Patients with thalassemia may also  have a decreased RET-He result  unrelated to iron availability.     If this patient  has chronic  kidney disease and does not have  a hemoglobinopathy he/she meets  criteria for iron deficiency  per the 2016 NICE guidelines.  Refer to specific guidelines to  determine the appropriate  thresholds for treating CKD-  associated iron deficiency. TSAT  and ferritin should be used in  patients with hemoglobinopathies  (e.g. thalassemia).     Ref Range & Units 2wk ago 66moago 89yrgo [id number] 60yr64yr  Sodium 135 - 145 mmol/L 138  138  130Low  R 134Low  R 139 R  Potassium 3.5 - 5.1 mmol/L 4.1  4.5  3.7 R 3.5 R 4.8 R  Chloride 98 - 111 mmol/L 103  105  93Low  R 98 R 105 R  CO2 22 - 32 mmol/L '27  25  27 ' R 28 R 26 R  Glucose, Bld 70 - 99 mg/dL 75  101High   98  89  81   BUN 6 - 20 mg/dL '11  14  18 ' R 13 R 18 R  Creatinine, Ser 0.44 - 1.00 mg/dL 1.19High   1.03High   1.31High  R 1.06 R 1.1 R  Calcium 8.9 - 10.3 mg/dL 10.1  9.6  10.0 R 9.9 R 9.9 R  Total Protein 6.5 - 8.1 g/dL 8.6High   8.6High   9.2High  R 8.9High  R 8.7High  R  Albumin 3.5 - 5.0 g/dL 4.2  4.2  3.1Low  R 3.4Low  R 3.9 R  AST 15 - 41 U/L '20  22  24 ' R 20 R 29 R  ALT 0 - 44 U/L '12  14  14 ' R 14 R 15 R  Alkaline Phosphatase 38 - 126 U/L 43  32Low   104 R 78 R 63 R  Total Bilirubin 0.3 - 1.2 mg/dL 1.7High   1.0  0.5  0.6  1.6High    GFR calc non Af Amer >60 mL/min 58Low   >60  54Low  R 70Low  R 75.51   GFR calc Af Amer >60 mL/min >60  >60 CM  63Low  R, CM 81Low  R, CM   Comment: (NOTE)   LDH 123 DAT negative Haptoglobin 136 Ferritin 54 Iron/TIBC 113/344 Iron saturation 33%  Diagnostic/Imaging Studies: CHEST - 2 VIEW  COMPARISON:  01/09/2015  FINDINGS: Sarcoidosis with biapical bandlike opacities and hilar retraction. No progression from prior. Normal heart size. No consolidation, effusion, or any edema. Branching calculus over the right kidney, notably progressed from 2013. Small cervical ribs.  IMPRESSION: 1. Stable parenchymal changes of sarcoidosis. 2. Large, branching right renal calculus that has significantly progressed from 2013. Recommend urology referral.  Kara Atkinson. 12/24/2017 16:13  Summary/Assessment: In the setting of low red blood cell indices without anemia or thrombocytopenia, she presents now for the results of her initial laboratory evaluation and recommendations.  On December 09, 2017 she was [facility] and [facility] concern of lymphadenopathy within the left neck associated with intermittent dizziness.  Small palpable lymph nodes were felt which by her account were present for nearly 3 months.  They were non-painful and had not changed significantly in size.  The dizziness that she experienced was positional and intermittent.  There was some minimal disequilibrium for which she recovered quickly.  She had no headaches, blurry vision, nausea, vomiting, chest or abdominal pain, or weight loss.  A routine complete blood count on September 24 revealed hemoglobin 12.8 hematocrit 39.9 RBC 5.32 MCV 75.0 MCH 24.1  RDW 14.4 WBC 2.6 with 45% neutrophils 41% lymphocytes 10% monocytes 3% eosinophils 1% basophil; platelets 151,000.  A review of her complete blood count from 6 years earlier showed hemoglobin 12.2 hematocrit 37.5 RBC 5.35 MCV 70.1 MCH 22.8 RDW 15.6 WBC 3.1; platelets 230,000.  She is not a vegetarian.  She has no known blood disorder or bleeding tendency  in her immediate family.  She is a gravida 2 para 2.  She menstruates on a fairly regular basis, but her last menses was on October 31.  It lasted 5 days. It is not particularly heavy. She has been on various forms of contraception including Depo-Provera from the age of 30 to 75 years.  She was on a contraceptive implant for 3 years, only recently removed in September.  She has not been on oral contraceptives. Since the age of 77, when she discontinued oral contraception, her menstruation has been fairly regular on a monthly basis with an implantable hormone device.  In 2012 she had a colonoscopy for a bleeding hemorrhoid. She denies any bleeding tendency or easy bruisability.  Prior to her first visit on October 29, she had been on oral iron for several months.  She stopped iron supplementation 2 weeks prior to that visit.  Her white blood cell count over the past month has increased from 2.7 to 3.4.  Her absolute neutrophil count is 1900 cells.  Because of a low MCV and low MCH, iron deficiency was suspected. The results of her iron studies however are not convincing. Those results are detailed below. They are possibly confounded by recent oral iron intake. Her reticulocyte hemoglobin does suggest iron deficiency in the absence of thalassemia.  A hemoglobin electrophoresis was normal.  Alpha thalassemia however is not ruled out.  She has a history of G6PD deficiency. Her hemolytic parameters however were negative.  She did not wish to be retested for G6PD.  In the past, her total bilirubin has been normal.  On October 29, the total bilirubin was 1.7. Unfortunately it was not fractionated to determine the direct or indirect component.   In the interim since her last visit, she reports no new problems or complaints.  Both her appetite and weight have remained stable. She reports no unusual headache, syncope, near syncopal episodes.  She has positional dizziness.  Her overall energy level is consistent with her  usual baseline.  There is no rash or itching.  She has no visual changes or hearing deficit.  She has no epistaxis or hemoptysis.  There is no significant cough, sore throat, or orthopnea.  She complains of mild dyspnea on exertion.  She has no chest or abdominal pain. There is no pain or difficulty in swallowing.  No fever, shaking chills, sweats, or flulike symptoms are reported. She has no heartburn or indigestion. She denies nausea, vomiting, diarrhea, constipation.  She moves her bowels 1-2 times daily.  There is no melena or bright red blood per rectum.  No urinary frequency, urgency, hematuria, or dysuria are reported.  She has no swelling of her ankles.  She denies arthralgias or myalgias.  There is no numbness or tingling in the fingers or toes.   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   Recommendation/Plan: I had a lengthy discussion with both Kara Atkinson and Kara Atkinson regarding her most recent laboratory studies.  The results of her laboratory studies are somewhat ambiguous with regard to iron  deficiency.  Her iron studies themselves are not convincing.  Her reticulocyte hemoglobin however is low, suggesting iron deficiency in the absence of thalassemia. The hemoglobin electrophoresis was normal.  As mentioned above, her absolute white blood cell count has increased over the past month from 2.7 to 3.4. Her absolute neutrophil count is 1900 cells.  She has had no recent infection.  Because she had been taking over-the-counter iron for several months, that might account for pattern of iron studies. The reticulocyte panel is a newer and an accurate way of identifying iron deficiency.  It is suggestive of iron deficiency. Those results are detailed above.    The comprehensive metabolic panel shows a small but rising serum creatinine and total protein.  5 years earlier her total protein was 9.2.  On  October 29, the total protein was 8.6.  In addition her total bilirubin was 1.7. This is a new finding. Unfortunately the total bilirubin was not fractionated.   It was recommended that she discontinue all iron supplementation. Repeat laboratory studies will include a repeat ferritin level.  Should her ferritin decrease, then the most likely explanation for her low red blood cell indices (MCV/MCH) is iron deficiency without depletion.  Alternatively, we discussed testing for alpha thalassemia.  She wishes to hold off on that testing until her iron studies are repeated.  Because of her elevated protein and total bilirubin, a serum protein electrophoresis, immunofixation, and free kappa/lambda light chain analysis were requested. Likewise her total bilirubin will be fractionated.  Both a total bilirubin, direct bilirubin, serum protein electrophoresis, immunofixation, and kappa light chain analysis have been scheduled for November 14. Because of the rise in bilirubin over 4 weeks, a right upper quadrant ultrasound was discussed. For the present, she wants to hold off on that test until the results of her repeat laboratory evaluation.  It was recommended in the strongest of terms that she obtain a primary care physician to act as her "gatekeeper."  She has a long-standing history of renal calculi.  Although her creatinine clearance is relatively stable, it was recommended that she obtain a urologic surgical opinion in a timely fashion.  Barring any unforeseen complications, her next scheduled doctor visit is in 5 weeks, December 17.  She was advised to call us in the interim should any new or untoward problems arise.  The total time spent discussing her previous laboratory studies and recommendations was 25 minutes. At least 50% of that time was spent in face-to-face discussion, counseling, and answering questions. All questions were answered to her satisfaction.   This note was dictated using voice  activated technology/software.  Unfortunately, typographical errors are not uncommon, and transcription is subject to mistakes and regrettably misinterpretation.  If necessary, clarification of the above information can be discussed with me at any time.  FOLLOW UP: AS DIRECTED   cc:      Asencion Noble, MD   Henreitta Leber, MD  Hematology/Oncology [place] [address] [place], [place] [address] Office: [phone] KGMW: [phone]

## 2018-01-27 NOTE — Patient Instructions (Signed)
We discussed in detail the results of your laboratory studies from October 29.  The iron studies are difficult to interpret since you were on iron several months prior to testing with a 2-week break before your first appointment.  They are equivocal.  To make matters more confusing, your reticulocyte hemoglobin suggests iron deficiency.  You are not anemic however.  There is no evidence of beta thalassemia.  Your hemolytic indices were normal.  Although you have a renal stone, the kidney function was relatively unchanged from 1 month earlier.  It was strongly recommended that she do obtain a urologic surgical consultation for their input.  The total bilirubin was mildly elevated at 1.7.  This was not expected.  It was not fractionated.  I would repeat that test at the time of your next lab draw.  Although we discussed testing for alpha thalassemia, it was agreed to defer that testing since you are not anemic and those results would not change our decision making at this point.  Be advised that it is important to obtain a primary care physician to handle all health maintenance issues.  It is recommended that you avoid iron supplements, and we will repeat the iron studies in 4-6 weeks for more clarification.   Barring any unforeseen complications, your next scheduled doctor visit with laboratory studies is on March 03, 2018.  Please do not hesitate to call should any new or untoward problems arise in the interim.

## 2018-01-28 ENCOUNTER — Other Ambulatory Visit: Payer: Self-pay | Admitting: Hematology and Oncology

## 2018-01-28 ENCOUNTER — Telehealth: Payer: Self-pay | Admitting: Hematology and Oncology

## 2018-01-28 DIAGNOSIS — E8809 Other disorders of plasma-protein metabolism, not elsewhere classified: Secondary | ICD-10-CM

## 2018-01-28 DIAGNOSIS — R779 Abnormality of plasma protein, unspecified: Secondary | ICD-10-CM

## 2018-01-28 NOTE — Telephone Encounter (Signed)
Called regarding 11/14 °

## 2018-01-29 ENCOUNTER — Inpatient Hospital Stay: Payer: Self-pay

## 2018-01-29 DIAGNOSIS — R718 Other abnormality of red blood cells: Secondary | ICD-10-CM

## 2018-01-29 DIAGNOSIS — E8809 Other disorders of plasma-protein metabolism, not elsewhere classified: Secondary | ICD-10-CM

## 2018-01-29 DIAGNOSIS — R779 Abnormality of plasma protein, unspecified: Secondary | ICD-10-CM

## 2018-01-29 LAB — FERRITIN: FERRITIN: 47 ng/mL (ref 11–307)

## 2018-01-29 LAB — BILIRUBIN, DIRECT: BILIRUBIN DIRECT: 0.2 mg/dL (ref 0.0–0.2)

## 2018-01-29 LAB — BILIRUBIN, TOTAL: Total Bilirubin: 1.4 mg/dL — ABNORMAL HIGH (ref 0.3–1.2)

## 2018-02-03 LAB — MULTIPLE MYELOMA PANEL, SERUM
ALBUMIN/GLOB SERPL: 1.2 (ref 0.7–1.7)
ALPHA 1: 0.2 g/dL (ref 0.0–0.4)
Albumin SerPl Elph-Mcnc: 4.2 g/dL (ref 2.9–4.4)
Alpha2 Glob SerPl Elph-Mcnc: 0.8 g/dL (ref 0.4–1.0)
B-Globulin SerPl Elph-Mcnc: 1 g/dL (ref 0.7–1.3)
GAMMA GLOB SERPL ELPH-MCNC: 1.7 g/dL (ref 0.4–1.8)
GLOBULIN, TOTAL: 3.6 g/dL (ref 2.2–3.9)
IGA: 208 mg/dL (ref 87–352)
IgG (Immunoglobin G), Serum: 1695 mg/dL — ABNORMAL HIGH (ref 700–1600)
IgM (Immunoglobulin M), Srm: 105 mg/dL (ref 26–217)
Total Protein ELP: 7.8 g/dL (ref 6.0–8.5)

## 2018-03-03 ENCOUNTER — Inpatient Hospital Stay: Payer: Self-pay | Attending: Hematology and Oncology | Admitting: Nurse Practitioner

## 2018-03-03 ENCOUNTER — Other Ambulatory Visit: Payer: Self-pay | Admitting: Nurse Practitioner

## 2018-03-03 ENCOUNTER — Inpatient Hospital Stay: Payer: Self-pay

## 2018-03-03 ENCOUNTER — Encounter: Payer: Self-pay | Admitting: Nurse Practitioner

## 2018-03-03 DIAGNOSIS — Z87442 Personal history of urinary calculi: Secondary | ICD-10-CM

## 2018-03-03 DIAGNOSIS — E8809 Other disorders of plasma-protein metabolism, not elsewhere classified: Secondary | ICD-10-CM

## 2018-03-03 DIAGNOSIS — R779 Abnormality of plasma protein, unspecified: Secondary | ICD-10-CM

## 2018-03-03 DIAGNOSIS — Z87891 Personal history of nicotine dependence: Secondary | ICD-10-CM

## 2018-03-03 DIAGNOSIS — R590 Localized enlarged lymph nodes: Secondary | ICD-10-CM

## 2018-03-03 DIAGNOSIS — D72819 Decreased white blood cell count, unspecified: Secondary | ICD-10-CM

## 2018-03-03 DIAGNOSIS — R718 Other abnormality of red blood cells: Secondary | ICD-10-CM

## 2018-03-03 DIAGNOSIS — D860 Sarcoidosis of lung: Secondary | ICD-10-CM

## 2018-03-03 DIAGNOSIS — D75A Glucose-6-phosphate dehydrogenase (G6PD) deficiency without anemia: Secondary | ICD-10-CM

## 2018-03-03 LAB — CMP (CANCER CENTER ONLY)
ALBUMIN: 4 g/dL (ref 3.5–5.0)
ALK PHOS: 37 U/L — AB (ref 38–126)
ALT: 14 U/L (ref 0–44)
ANION GAP: 8 (ref 5–15)
AST: 19 U/L (ref 15–41)
BILIRUBIN TOTAL: 1.2 mg/dL (ref 0.3–1.2)
BUN: 12 mg/dL (ref 6–20)
CALCIUM: 9.6 mg/dL (ref 8.9–10.3)
CO2: 25 mmol/L (ref 22–32)
Chloride: 107 mmol/L (ref 98–111)
Creatinine: 1.14 mg/dL — ABNORMAL HIGH (ref 0.44–1.00)
GFR, Est AFR Am: >60 mL/min (ref >60)
GFR, Estimated: >60 mL/min (ref >60)
GLUCOSE: 88 mg/dL (ref 70–99)
POTASSIUM: 4.4 mmol/L (ref 3.5–5.1)
SODIUM: 140 mmol/L (ref 135–145)
TOTAL PROTEIN: 8 g/dL (ref 6.5–8.1)

## 2018-03-03 LAB — CBC WITH DIFFERENTIAL (CANCER CENTER ONLY)
Abs Immature Granulocytes: 0 10*3/uL (ref 0.00–0.07)
BASOS ABS: 0 10*3/uL (ref 0.0–0.1)
Basophils Relative: 1 %
EOS ABS: 0.1 10*3/uL (ref 0.0–0.5)
EOS PCT: 2 %
HEMATOCRIT: 37.8 % (ref 36.0–46.0)
HEMOGLOBIN: 11.7 g/dL — AB (ref 12.0–15.0)
Immature Granulocytes: 0 %
Lymphocytes Relative: 33 %
Lymphs Abs: 1.2 10*3/uL (ref 0.7–4.0)
MCH: 23.4 pg — AB (ref 26.0–34.0)
MCHC: 31 g/dL (ref 30.0–36.0)
MCV: 75.4 fL — ABNORMAL LOW (ref 80.0–100.0)
MONO ABS: 0.4 10*3/uL (ref 0.1–1.0)
Monocytes Relative: 10 %
NRBC: 0 % (ref 0.0–0.2)
Neutro Abs: 1.9 10*3/uL (ref 1.7–7.7)
Neutrophils Relative %: 54 %
Platelet Count: 155 10*3/uL (ref 150–400)
RBC: 5.01 MIL/uL (ref 3.87–5.11)
RDW: 13.9 % (ref 11.5–15.5)
WBC: 3.5 10*3/uL — AB (ref 4.0–10.5)

## 2018-03-03 NOTE — Progress Notes (Signed)
[place] CONSULT NOTE  Patient Care Team: Patient, No Pcp Per as PCP - General (General Practice)  CHIEF COMPLAINTS/PURPOSE OF CONSULTATION:  Low MCV/MCH and high bilirubin with no anemia.    HISTORY OF PRESENTING ILLNESS:  Kara Atkinson 37 y.o. female is here because of a referral after she had blood work drawn at an Urgent Care for lymphadenopathy. At that time she had a bilirubin of 1.7, and a low MCV  Of 75.4 (range 80 - 100)  and MCH of 23 ( range 26-34)   She had decided to supplement her diet with oral iron supplements, and various vitamins and supplements - she believes this is why her labs showed numerous abnormalities. Her lymphadenopathy has resolved, and was painless.   We rechecked numerous studies, and today her CBC is stable with no change in her MCV or MCH.  She recently had her menses and her hgb is 11.7.  However her most recent ferritin is normal and her bilirubin is now normal as well at 1.2 . She quit taking additional iron at the end of October. She continues to have a mildly elevated IgG level at 1695 (range [phone]).   For a more detailed history please see Dr. Payton Spark note of 01/27/2018.  We will see her back in 6 months for repeat labs and follow up. Case discussed with Dr. Benay Spice.    MEDICAL HISTORY:  Past Medical History:  Diagnosis Date  . Glucose-6-phosphate dehydrogenase deficiency   . Kidney stone   . Pulmonary sarcoidosis ([place])     SURGICAL HISTORY: Past Surgical History:  Procedure Laterality Date  . cystine stone removal      SOCIAL HISTORY: Social History   Socioeconomic History  . Marital status: Married    Spouse name: Not on file  . Number of children: Not on file  . Years of education: Not on file  . Highest education level: Not on file  Occupational History  . Occupation: CNA  Social Needs  . Financial resource strain: Not on file  . Food insecurity:    Worry: Not on file    Inability: Not on file  .  Transportation needs:    Medical: Not on file    Non-medical: Not on file  Tobacco Use  . Smoking status: Former Smoker    Packs/day: 0.50    Years: 10.00    Pack years: 5.00    Types: Cigarettes    Last attempt to quit: 10/16/2008    Years since quitting: 9.3  . Smokeless tobacco: Never Used  Substance and Sexual Activity  . Alcohol use: No  . Drug use: No  . Sexual activity: Not on file  Lifestyle  . Physical activity:    Days per week: Not on file    Minutes per session: Not on file  . Stress: Not on file  Relationships  . Social connections:    Talks on phone: Not on file    Gets together: Not on file    Attends religious service: Not on file    Active member of club or organization: Not on file    Attends meetings of clubs or organizations: Not on file    Relationship status: Not on file  . Intimate partner violence:    Fear of current or ex partner: Not on file    Emotionally abused: Not on file    Physically abused: Not on file    Forced sexual activity: Not on file  Other Topics Concern  .  Not on file  Social History Narrative  . Not on file    FAMILY HISTORY: Family History  Problem Relation Age of Onset  . Sarcoidosis Unknown        cousin    ALLERGIES:  is allergic to sulfonamide derivatives.  MEDICATIONS:  No current outpatient medications on file.   No current facility-administered medications for this visit.      Recent Results (from the past 2160 hour(s))  Comprehensive metabolic panel     Status: Abnormal   Collection Time: 12/09/17  8:39 AM  Result Value Ref Range   Sodium 138 135 - 145 mmol/L   Potassium 4.5 3.5 - 5.1 mmol/L   Chloride 105 98 - 111 mmol/L   CO2 25 22 - 32 mmol/L   Glucose, Bld 101 (H) 70 - 99 mg/dL   BUN 14 6 - 20 mg/dL   Creatinine, Ser 1.03 (H) 0.44 - 1.00 mg/dL   Calcium 9.6 8.9 - 10.3 mg/dL   Total Protein 8.6 (H) 6.5 - 8.1 g/dL   Albumin 4.2 3.5 - 5.0 g/dL   AST 22 15 - 41 U/L   ALT 14 0 - 44 U/L   Alkaline  Phosphatase 32 (L) 38 - 126 U/L   Total Bilirubin 1.0 0.3 - 1.2 mg/dL   GFR calc non Af Amer >60 >60 mL/min   GFR calc Af Amer >60 >60 mL/min    Comment: (NOTE) The eGFR has been calculated using the CKD EPI equation. This calculation has not been validated in all clinical situations. eGFR's persistently <60 mL/min signify possible Chronic Kidney Disease.    Anion gap 8 5 - 15    Comment: Performed at [facility], [place] [address], [place], [place] [address]  CBC with Differential     Status: Abnormal   Collection Time: 12/09/17  8:39 AM  Result Value Ref Range   WBC 2.6 (L) 4.0 - 10.5 K/uL   RBC 5.32 (H) 3.87 - 5.11 MIL/uL   Hemoglobin 12.8 12.0 - 15.0 g/dL   HCT 39.9 36.0 - 46.0 %   MCV 75.0 (L) 78.0 - 100.0 fL   MCH 24.1 (L) 26.0 - 34.0 pg   MCHC 32.1 30.0 - 36.0 g/dL   RDW 14.4 11.5 - 15.5 %   Platelets 151 150 - 400 K/uL   Neutrophils Relative % 45 %   Neutro Abs 1.2 (L) 1.7 - 7.7 K/uL   Lymphocytes Relative 41 %   Lymphs Abs 1.1 0.7 - 4.0 K/uL   Monocytes Relative 10 %   Monocytes Absolute 0.3 0.1 - 1.0 K/uL   Eosinophils Relative 3 %   Eosinophils Absolute 0.1 0.0 - 0.7 K/uL   Basophils Relative 1 %   Basophils Absolute 0.0 0.0 - 0.1 K/uL    Comment: Performed at [facility], [place] [address] [place], [place] [address]  I-Stat beta hCG blood, ED     Status: None   Collection Time: 12/09/17  8:43 AM  Result Value Ref Range   I-stat hCG, quantitative <5.0 <5 mIU/mL   Comment 3            Comment:   GEST. AGE      CONC.  (mIU/mL)   <=1 WEEK        5 - 50     2 WEEKS       50 - 500     3 WEEKS       100 - 10,000  4 WEEKS     1,000 - 30,000        FEMALE AND NON-PREGNANT FEMALE:     LESS THAN 5 mIU/mL   CBC with Differential/Platelet     Status: Abnormal   Collection Time: 12/24/17 11:32 AM  Result Value Ref Range   WBC 2.8 (L) 3.4 - 10.8 x10E3/uL   RBC 5.74 (H) 3.77 - 5.28 x10E6/uL   Hemoglobin 13.0 11.1 - 15.9 g/dL    Hematocrit 42.9 34.0 - 46.6 %   MCV 75 (L) 79 - 97 fL   MCH 22.6 (L) 26.6 - 33.0 pg   MCHC 30.3 (L) 31.5 - 35.7 g/dL   RDW 14.4 12.3 - 15.4 %   Platelets 221 150 - 450 x10E3/uL   Neutrophils 50 Not Estab. %   Lymphs 37 Not Estab. %   Monocytes 8 Not Estab. %   Eos 4 Not Estab. %   Basos 1 Not Estab. %   Neutrophils Absolute 1.4 1.4 - 7.0 x10E3/uL   Lymphocytes Absolute 1.0 0.7 - 3.1 x10E3/uL   Monocytes Absolute 0.2 0.1 - 0.9 x10E3/uL   EOS (ABSOLUTE) 0.1 0.0 - 0.4 x10E3/uL   Basophils Absolute 0.0 0.0 - 0.2 x10E3/uL   Immature Granulocytes 0 Not Estab. %   Immature Grans (Abs) 0.0 0.0 - 0.1 x10E3/uL  Angiotensin converting enzyme     Status: None   Collection Time: 12/24/17 11:32 AM  Result Value Ref Range   Angio Convert Enzyme 40 14 - 82 U/L  Pathologist smear review     Status: Abnormal   Collection Time: 12/24/17 11:32 AM  Result Value Ref Range   Path Rev WBC Comment     Comment: Absolute WBC subset counts are normal.   Path Rev RBC Comment (A)     Comment: Polycythemia pattern due to erythrocytosis.   Path Rev PLTs Appear normal.    PATHOLOGIST NAME Comment     Comment: Reviewed by:  Delorse Lek, MD  Pathologist   WBC 2.7 (L) 3.4 - 10.8 x10E3/uL   RBC 5.45 (H) 3.77 - 5.28 x10E6/uL   Hemoglobin 13.3 11.1 - 15.9 g/dL   Hematocrit 41.5 34.0 - 46.6 %   MCV 76 (L) 79 - 97 fL   MCH 24.4 (L) 26.6 - 33.0 pg   MCHC 32.0 31.5 - 35.7 g/dL   RDW 15.5 (H) 12.3 - 15.4 %   Platelets 214 150 - 450 x10E3/uL   Neutrophils 54 Not Estab. %   Lymphs 37 Not Estab. %   Monocytes 5 Not Estab. %   Eos 4 Not Estab. %   Basos 0 Not Estab. %   Neutrophils Absolute 1.4 1.4 - 7.0 x10E3/uL   Lymphocytes Absolute 1.0 0.7 - 3.1 x10E3/uL   Monocytes Absolute 0.1 0.1 - 0.9 x10E3/uL   EOS (ABSOLUTE) 0.1 0.0 - 0.4 x10E3/uL   Basophils Absolute 0.0 0.0 - 0.2 x10E3/uL   Immature Granulocytes 0 Not Estab. %   Immature Grans (Abs) 0.0 0.0 - 0.1 x10E3/uL  Specimen status report     Status: None    Collection Time: 12/24/17 11:32 AM  Result Value Ref Range   specimen status report Comment     Comment: Written Authorization Written Authorization Written Authorization Received. Authorization received from Asencion Noble 12-25-2017 Logged by Lorayne Marek   Technologist smear review     Status: None   Collection Time: 01/13/18  1:13 PM  Result Value Ref Range   Tech Review  SMEAR CONSISTENT WITH AUTO DIFF, FEW POLY AND OVALS, OC TARGET, PLTE WNL    Comment: Performed at [facility], [address], [place], [place] [address]  Hemoglobinopathy evaluation     Status: None   Collection Time: 01/13/18  1:13 PM  Result Value Ref Range   Hgb A2 Quant 2.1 1.8 - 3.2 %   Hgb F Quant 0.0 0.0 - 2.0 %   Hgb S Quant 0.0 0.0 %   Hgb C 0.0 0.0 %   Hgb A 97.9 96.4 - 98.8 %   Hgb Variant 0.0 0.0 %   Please Note: Comment     Comment: (NOTE) Normal adult hemoglobin present. Performed At: [facility] [place], [place] [id number] Rush Farmer MD VQ:[id number]   Beta 2 microglobulin, serum     Status: None   Collection Time: 01/13/18  1:13 PM  Result Value Ref Range   Beta-2 Microglobulin 1.6 0.6 - 2.4 mg/L    Comment: (NOTE) Siemens Immulite 2000 Immunochemiluminometric assay (ICMA) Values obtained with different assay methods or kits cannot be used interchangeably. Results cannot be interpreted as absolute evidence of the presence or absence of malignant disease. Performed At: [facility] [place], [place] [id number] Rush Farmer MD YK:[id number]   Retic Panel     Status: Abnormal   Collection Time: 01/13/18  1:13 PM  Result Value Ref Range   Retic Ct Pct 1.7 0.4 - 3.1 %   RBC. 5.29 (H) 3.87 - 5.11 MIL/uL   Retic Count, Absolute 91.0 19.0 - 186.0 K/uL   Immature Retic Fract 5.9 2.3 - 15.9 %   Reticulocyte Hemoglobin 27.2 (L) >27.9 pg    Comment:        A RET-He < 28 pg is an indication of  iron-deficient or iron- insufficient erythropoiesis. Patients with thalassemia may also have a decreased RET-He result unrelated to iron availability.     If this patient has chronic kidney disease and does not have a hemoglobinopathy he/she meets criteria for iron deficiency per the 2016 NICE guidelines. Refer to specific guidelines to determine the appropriate thresholds for treating CKD- associated iron deficiency. TSAT and ferritin should be used in patients with hemoglobinopathies (e.g. thalassemia). Performed at [facility], [place] [address] [place], [place] [address]   Haptoglobin     Status: None   Collection Time: 01/13/18  1:13 PM  Result Value Ref Range   Haptoglobin 136 34 - 200 mg/dL    Comment: (NOTE) Performed At: [facility] [place], [place] [id number] Rush Farmer MD IO:[id number]   Lactate dehydrogenase     Status: None   Collection Time: 01/13/18  1:13 PM  Result Value Ref Range   LDH 123 98 - 192 U/L    Comment: Performed at [facility], [place] [address], [place], [place] [address]  CBC with Differential ([place] Only)     Status: Abnormal   Collection Time: 01/13/18  1:13 PM  Result Value Ref Range   WBC Count 3.4 (L) 4.0 - 10.5 K/uL   RBC 5.29 (H) 3.87 - 5.11 MIL/uL   Hemoglobin 12.5 12.0 - 15.0 g/dL   HCT 39.9 36.0 - 46.0 %   MCV 75.4 (L) 80.0 - 100.0 fL   MCH 23.6 (L) 26.0 - 34.0 pg   MCHC 31.3 30.0 - 36.0 g/dL   RDW 14.1 11.5 - 15.5 %   Platelet Count 191 150 -  400 K/uL   nRBC 0.0 0.0 - 0.2 %   Neutrophils Relative % 55 %   Neutro Abs 1.9 1.7 - 7.7 K/uL   Lymphocytes Relative 32 %   Lymphs Abs 1.1 0.7 - 4.0 K/uL   Monocytes Relative 10 %   Monocytes Absolute 0.4 0.1 - 1.0 K/uL   Eosinophils Relative 2 %   Eosinophils Absolute 0.1 0.0 - 0.5 K/uL   Basophils Relative 1 %   Basophils Absolute 0.0 0.0 - 0.1 K/uL   Immature Granulocytes 0 %   Abs Immature  Granulocytes 0.00 0.00 - 0.07 K/uL    Comment: Performed at [facility], [place] [address] [place], [place] [address]  Comprehensive metabolic panel     Status: Abnormal   Collection Time: 01/13/18  1:13 PM  Result Value Ref Range   Sodium 138 135 - 145 mmol/L   Potassium 4.1 3.5 - 5.1 mmol/L   Chloride 103 98 - 111 mmol/L   CO2 27 22 - 32 mmol/L   Glucose, Bld 75 70 - 99 mg/dL   BUN 11 6 - 20 mg/dL   Creatinine, Ser 1.19 (H) 0.44 - 1.00 mg/dL   Calcium 10.1 8.9 - 10.3 mg/dL   Total Protein 8.6 (H) 6.5 - 8.1 g/dL   Albumin 4.2 3.5 - 5.0 g/dL   AST 20 15 - 41 U/L   ALT 12 0 - 44 U/L   Alkaline Phosphatase 43 38 - 126 U/L   Total Bilirubin 1.7 (H) 0.3 - 1.2 mg/dL   GFR calc non Af Amer 58 (L) >60 mL/min   GFR calc Af Amer >60 >60 mL/min    Comment: (NOTE) The eGFR has been calculated using the CKD EPI equation. This calculation has not been validated in all clinical situations. eGFR's persistently <60 mL/min signify possible Chronic Kidney Disease.    Anion gap 8 5 - 15    Comment: Performed at [facility], [address] [place], [place] [address]  Direct antiglobulin test (not at [facility])     Status: None   Collection Time: 01/13/18  1:14 PM  Result Value Ref Range   DAT, complement NEG    DAT, IgG      NEG Performed at [place] [address], [place], [place] [address]   Ferritin     Status: None   Collection Time: 01/13/18  1:14 PM  Result Value Ref Range   Ferritin 54 11 - 307 ng/mL    Comment: Performed at [facility], [place] [address], [place], [place] [address]  Iron and TIBC     Status: None   Collection Time: 01/13/18  1:14 PM  Result Value Ref Range   Iron 113 41 - 142 ug/dL   TIBC 344 236 - 444 ug/dL   Saturation Ratios 33 21 - 57 %   UIBC 230 120 - 384 ug/dL    Comment: Performed at [facility], [place] [address], [place], [place]  [address]  Multiple Myeloma Panel (SPEP&IFE w/QIG)     Status: Abnormal   Collection Time: 01/29/18 10:27 AM  Result Value Ref Range   IgG (Immunoglobin G), Serum 1,695 (H) 700 - 1,600 mg/dL   IgA 208 87 - 352 mg/dL   IgM (Immunoglobulin M), Srm 105 26 - 217 mg/dL   Total Protein ELP 7.8 6.0 - 8.5 g/dL   Albumin SerPl Elph-Mcnc 4.2 2.9 - 4.4 g/dL  Alpha 1 0.2 0.0 - 0.4 g/dL   Alpha2 Glob SerPl Elph-Mcnc 0.8 0.4 - 1.0 g/dL   B-Globulin SerPl Elph-Mcnc 1.0 0.7 - 1.3 g/dL   Gamma Glob SerPl Elph-Mcnc 1.7 0.4 - 1.8 g/dL   M Protein SerPl Elph-Mcnc Not Observed Not Observed g/dL   Globulin, Total 3.6 2.2 - 3.9 g/dL   Albumin/Glob SerPl 1.2 0.7 - 1.7   IFE 1 Comment     Comment: (NOTE) An apparent polyclonal gammopathy: IgG. Kappa and lambda typing appear increased.    Please Note Comment     Comment: (NOTE) Protein electrophoresis scan will follow via computer, mail, or courier delivery. Performed At: [facility] [place], [place] [id number] Rush Farmer MD [id number]   Bilirubin, total     Status: Abnormal   Collection Time: 01/29/18 10:28 AM  Result Value Ref Range   Total Bilirubin 1.4 (H) 0.3 - 1.2 mg/dL    Comment: Performed at [facility], [place] [address] [place], [place] [address]  Bilirubin, direct     Status: None   Collection Time: 01/29/18 10:28 AM  Result Value Ref Range   Bilirubin, Direct 0.2 0.0 - 0.2 mg/dL    Comment: Performed at [facility], [place] [address] [place], [place] [address]  Ferritin     Status: None   Collection Time: 01/29/18 10:28 AM  Result Value Ref Range   Ferritin 47 11 - 307 ng/mL    Comment: Performed at [facility], [place] [address], [place], [place] [address]  CBC with Differential ([place] Only)     Status: Abnormal   Collection Time: 03/03/18  9:22 AM  Result Value Ref Range   WBC Count 3.5 (L) 4.0 - 10.5 K/uL   RBC 5.01 3.87 - 5.11  MIL/uL   Hemoglobin 11.7 (L) 12.0 - 15.0 g/dL   HCT 37.8 36.0 - 46.0 %   MCV 75.4 (L) 80.0 - 100.0 fL   MCH 23.4 (L) 26.0 - 34.0 pg   MCHC 31.0 30.0 - 36.0 g/dL   RDW 13.9 11.5 - 15.5 %   Platelet Count 155 150 - 400 K/uL   nRBC 0.0 0.0 - 0.2 %   Neutrophils Relative % 54 %   Neutro Abs 1.9 1.7 - 7.7 K/uL   Lymphocytes Relative 33 %   Lymphs Abs 1.2 0.7 - 4.0 K/uL   Monocytes Relative 10 %   Monocytes Absolute 0.4 0.1 - 1.0 K/uL   Eosinophils Relative 2 %   Eosinophils Absolute 0.1 0.0 - 0.5 K/uL   Basophils Relative 1 %   Basophils Absolute 0.0 0.0 - 0.1 K/uL   Immature Granulocytes 0 %   Abs Immature Granulocytes 0.00 0.00 - 0.07 K/uL    Comment: Performed at [facility], [place] [address] [place], [place] [address]  CMP ([place] only)     Status: Abnormal   Collection Time: 03/03/18  9:22 AM  Result Value Ref Range   Sodium 140 135 - 145 mmol/L   Potassium 4.4 3.5 - 5.1 mmol/L   Chloride 107 98 - 111 mmol/L   CO2 25 22 - 32 mmol/L   Glucose, Bld 88 70 - 99 mg/dL   BUN 12 6 - 20 mg/dL   Creatinine 1.14 (H) 0.44 - 1.00 mg/dL   Calcium 9.6 8.9 - 10.3 mg/dL   Total Protein 8.0 6.5 - 8.1 g/dL   Albumin 4.0 3.5 - 5.0 g/dL  AST 19 15 - 41 U/L   ALT 14 0 - 44 U/L   Alkaline Phosphatase 37 (L) 38 - 126 U/L   Total Bilirubin 1.2 0.3 - 1.2 mg/dL   GFR, Est Non Af Am >60 >60 mL/min   GFR, Est AFR Am >60 >60 mL/min   Anion gap 8 5 - 15    Comment: Performed at [facility], [place] [address] [place], [place] [address]    REVIEW OF SYSTEMS:   She is feeling fine with no new complaints or concerns  Behavioral/Psych: Mood is stable, no new changes  All other systems were reviewed with the patient and are negative.  PHYSICAL EXAMINATION: ECOG PERFORMANCE STATUS: 0 - Asymptomatic  Vitals:   03/03/18 1014  BP: 98/71  Pulse: 62  Resp: 17  Temp: 98.2 F (36.8 C)  SpO2: 100%   Filed Weights   03/03/18 1014  Weight: 172  lb 14.4 oz (78.4 kg)    GENERAL:alert, no distress and comfortable SKIN: skin color, texture, turgor are normal, no rashes or significant lesions EYES: normal, conjunctiva are pink and non-injected, sclera clear, wears corrective lenses NECK: supple, thyroid normal size, non-tender, without nodularity LUNGS: clear to auscultation and percussion with normal breathing effort HEART: regular rate & rhythm and no murmurs and no lower extremity edema  Musculoskeletal:no cyanosis of digits and no clubbing  PSYCH: alert & oriented x 3 with fluent speech NEURO: no focal motor/sensory deficits   ASSESSMENT & PLAN:   Hyperbilirubinemia - this has now resolved with a bilirubin of 1.2 Low MCV/MCH with normal ferritin, and no significant anemia - we will continue to monitor and follow. Repeat labs in 6 months.   She otherwise feels great. She exercises regularly and eats a healthy diet, but is not going to take additional supplements anymore beyond a regular multivitamin. She was counseled that we could more aggressively pursue potential causes of her low MCV/low [facility] but she would prefer to monitor and repeat labs in a few months. Certainly if she feels bad or has labs drawn elsewhere that show additional issues we can see her sooner.  She is currently does not have a PCP as she is in middle of changing insurance coverage.   Case discussed with Dr. Benay Spice.  All questions were answered. The patient knows to call the clinic with any problems, questions or concerns. I spent 20 minutes counseling the patient face to face. The total time spent in the appointment was 30 minutes and more than 50% was on counseling.     Bill Salinas, NP-C, AOCNP 03/03/18 9:26 PM

## 2018-03-04 ENCOUNTER — Telehealth: Payer: Self-pay | Admitting: Nurse Practitioner

## 2018-03-04 NOTE — Telephone Encounter (Signed)
Printed and mailed calendar. °

## 2018-09-01 ENCOUNTER — Other Ambulatory Visit: Payer: Self-pay | Admitting: *Deleted

## 2018-09-01 DIAGNOSIS — R590 Localized enlarged lymph nodes: Secondary | ICD-10-CM

## 2018-09-01 DIAGNOSIS — D72819 Decreased white blood cell count, unspecified: Secondary | ICD-10-CM

## 2018-09-02 ENCOUNTER — Inpatient Hospital Stay: Payer: Medicaid Other | Attending: Hematology

## 2018-09-02 ENCOUNTER — Inpatient Hospital Stay: Payer: Medicaid Other | Admitting: Hematology

## 2018-12-15 ENCOUNTER — Telehealth: Payer: Self-pay | Admitting: General Practice

## 2018-12-15 NOTE — Telephone Encounter (Addendum)
Pt called to request an update on a urology referral, no referral is on the system however she states on her last visit this was requested due to her kidney issues, pt was last seen a year ago, scheduled a next week appt and made aware that if needed to go to the nearest urgent care or ER, pt understood.

## 2018-12-22 ENCOUNTER — Ambulatory Visit (INDEPENDENT_AMBULATORY_CARE_PROVIDER_SITE_OTHER): Payer: Self-pay | Admitting: Internal Medicine

## 2018-12-22 DIAGNOSIS — R310 Gross hematuria: Secondary | ICD-10-CM

## 2018-12-22 DIAGNOSIS — Z87442 Personal history of urinary calculi: Secondary | ICD-10-CM

## 2018-12-22 NOTE — Progress Notes (Signed)
Would like a referral to Urology for hematuria. Denies pain with urination, burning, flank pain, pelvic pain, nausea, vomiting, fever, chills. No prior history. Has since resolved but would still like work up.

## 2018-12-22 NOTE — Progress Notes (Signed)
Virtual Visit via Telephone Note Due to current restrictions/limitations of in-office visits due to the COVID-19 pandemic, this scheduled clinical appointment was converted to a telehealth visit  I connected with Kara Atkinson on 12/22/18 at 4;06 p.m by telephone and verified that I am speaking with the correct person using two identifiers.  I am in my office.  The patient is at home.  Only the patient and myself participated in this encounter.  I discussed the limitations, risks, security and privacy concerns of performing an evaluation and management service by telephone and the availability of in person appointments. I also discussed with the patient that there may be a patient responsible charge related to this service. The patient expressed understanding and agreed to proceed.   History of Present Illness: Pt with hx of sarcoidosis  Pt c/o noticing blood in urine 1 wk ago that lasted 3-4 days. She was on menses at the time but after menses went off, she continued to notice it.  There was no associated dysuria, flank or abdominal pain or fever. Hx of renal stones and she has passed several stones over the yrs.  Last time she past a stone was a few yrs ago.  Had surgical procedure to remove a stone in 2009. Dx with cystine stones .  Had CXR last yr that revealed incidental finding of large branching RT renal calculus that had progressed from 2013 Wants referral to urologist   Observations/Objective: No direct observation done as this was a telephone encounter  Assessment and Plan: 1. Gross hematuria - Ambulatory referral to Urology  2. History of nephrolithiasis - Ambulatory referral to Urology   Follow Up Instructions: PRN   I discussed the assessment and treatment plan with the patient. The patient was provided an opportunity to ask questions and all were answered. The patient agreed with the plan and demonstrated an understanding of the instructions.   The patient was advised  to call back or seek an in-person evaluation if the symptoms worsen or if the condition fails to improve as anticipated.  I provided 8 minutes of non-face-to-face time during this encounter.   Karle Plumber, MD

## 2018-12-23 ENCOUNTER — Ambulatory Visit: Payer: Medicaid Other

## 2019-01-19 ENCOUNTER — Other Ambulatory Visit: Payer: Self-pay | Admitting: Urology

## 2019-02-09 ENCOUNTER — Encounter (HOSPITAL_COMMUNITY): Payer: Self-pay

## 2019-02-09 NOTE — Patient Instructions (Addendum)
DUE TO COVID-19 ONLY ONE VISITOR IS ALLOWED TO COME WITH YOU AND STAY IN THE WAITING ROOM ONLY DURING PRE OP AND PROCEDURE DAY OF SURGERY. THE 1 VISITOR MAY VISIT WITH YOU AFTER SURGERY IN YOUR PRIVATE ROOM DURING VISITING HOURS ONLY!  YOU NEED TO HAVE A COVID 19 TEST ON_Saturday 11/28/2020______ @_______ , THIS TEST MUST BE DONE BEFORE SURGERY, COME  [address], [place] , [phone].  [facility] ONCE YOUR COVID TEST IS COMPLETED, PLEASE BEGIN THE QUARANTINE INSTRUCTIONS AS OUTLINED IN YOUR HANDOUT.                Kara Atkinson    Your procedure is scheduled on: Wednesday 02/17/2019   [facility]    Report to admitting at 8:10 am     Call this number if you have problems the morning of surgery [phone]    Remember: Do not eat food after Midnight.    BRUSH YOUR TEETH MORNING OF SURGERY AND RINSE YOUR MOUTH OUT, NO CHEWING GUM CANDY OR MINTS.   NO SOLID FOOD AFTER MIDNIGHT THE NIGHT PRIOR TO SURGERY.   Take these medicines the morning of surgery with A SIP OF WATER: None                                 You may not have any metal on your body including hair pins and              piercings  Do not wear jewelry, make-up, lotions, powders or perfumes, deodorant             Do not wear nail polish on your fingernails.  Do not shave  48 hours prior to surgery.                 Do not bring valuables to the hospital.  IS NOT             RESPONSIBLE   FOR VALUABLES.  Contacts, dentures or bridgework may not be worn into surgery.  Leave suitcase in the car. After surgery it may be brought to your room.     Patients discharged the day of surgery will not be allowed to drive home. IF YOU ARE HAVING SURGERY AND GOING HOME THE SAME DAY, YOU MUST HAVE AN ADULT TO DRIVE YOU HOME AND  BE WITH YOU FOR 24 HOURS. YOU MAY GO HOME BY TAXI OR UBER OR ORTHERWISE, BUT AN ADULT MUST ACCOMPANY YOU HOME AND STAY WITH YOU FOR 24  HOURS.    Name and phone number of your driver: Husband Donell BeersRyan Lafrance [phone]    Special Instructions: N/A              Please read over the following fact sheets you were given: _____________________________________________________________________             [facility] Pre[facility] Before surgery, you can play an important role.  Because skin is not sterile, your skin needs to be as free of germs as possible.  You can reduce the number of germs on your skin by washing with CHG (chlorahexidine gluconate) soap before surgery.  CHG is an antiseptic cleaner which kills germs and bonds with the skin to continue killing germs even after washing. Please DO NOT use if you have an allergy to CHG or antibacterial soaps.  If your skin becomes reddened/irritated  stop using the CHG and inform your nurse when you arrive at Short Stay. Do not shave (including legs and underarms) for at least 48 hours prior to the first CHG shower.  You may shave your face/neck. Please follow these instructions carefully:  1.  Shower with CHG Soap the night before surgery and the  morning of Surgery.  2.  If you choose to wash your hair, wash your hair first as usual with your  normal  shampoo.  3.  After you shampoo, rinse your hair and body thoroughly to remove the  shampoo.                             4.  Use CHG as you would any other liquid soap.  You can apply chg directly  to the skin and wash                       Gently with a scrungie or clean washcloth.  5.  Apply the CHG Soap to your body ONLY FROM THE NECK DOWN.   Do not use on face/ open                           Wound or open sores. Avoid contact with eyes, ears mouth and genitals (private parts).                       Wash face,  Genitals (private parts) with your normal soap.             6.  Wash thoroughly, paying special attention to the area where your surgery  will be performed.  7.  Thoroughly rinse your body with warm water from the  neck down.  8.  DO NOT shower/wash with your normal soap after using and rinsing off  the CHG Soap.                9.  Pat yourself dry with a clean towel.            10.  Wear clean pajamas.            11.  Place clean sheets on your bed the night of your first shower and do not  sleep with pets. Day of Surgery : Do not apply any lotions/deodorants the morning of surgery.  Please wear clean clothes to the hospital/surgery center.  FAILURE TO FOLLOW THESE INSTRUCTIONS MAY RESULT IN THE CANCELLATION OF YOUR SURGERY PATIENT SIGNATURE_________________________________  NURSE SIGNATURE__________________________________  ________________________________________________________________________  WHAT IS A BLOOD TRANSFUSION? Blood Transfusion Information  A transfusion is the replacement of blood or some of its parts. Blood is made up of multiple cells which provide different functions.  Red blood cells carry oxygen and are used for blood loss replacement.  White blood cells fight against infection.  Platelets control bleeding.  Plasma helps clot blood.  Other blood products are available for specialized needs, such as hemophilia or other clotting disorders. BEFORE THE TRANSFUSION  Who gives blood for transfusions?   Healthy volunteers who are fully evaluated to make sure their blood is safe. This is blood bank blood. Transfusion therapy is the safest it has ever been in the practice of medicine. Before blood is taken from a donor, a complete history is taken to make sure that person has no history of diseases nor engages in risky social  behavior (examples are intravenous drug use or sexual activity with multiple partners). The donor's travel history is screened to minimize risk of transmitting infections, such as malaria. The donated blood is tested for signs of infectious diseases, such as HIV and hepatitis. The blood is then tested to be sure it is compatible with you in order to minimize  the chance of a transfusion reaction. If you or a relative donates blood, this is often done in anticipation of surgery and is not appropriate for emergency situations. It takes many days to process the donated blood. RISKS AND COMPLICATIONS Although transfusion therapy is very safe and saves many lives, the main dangers of transfusion include:   Getting an infectious disease.  Developing a transfusion reaction. This is an allergic reaction to something in the blood you were given. Every precaution is taken to prevent this. The decision to have a blood transfusion has been considered carefully by your caregiver before blood is given. Blood is not given unless the benefits outweigh the risks. AFTER THE TRANSFUSION  Right after receiving a blood transfusion, you will usually feel much better and more energetic. This is especially true if your red blood cells have gotten low (anemic). The transfusion raises the level of the red blood cells which carry oxygen, and this usually causes an energy increase.  The nurse administering the transfusion will monitor you carefully for complications. HOME CARE INSTRUCTIONS  No special instructions are needed after a transfusion. You may find your energy is better. Speak with your caregiver about any limitations on activity for underlying diseases you may have. SEEK MEDICAL CARE IF:   Your condition is not improving after your transfusion.  You develop redness or irritation at the intravenous (IV) site. SEEK IMMEDIATE MEDICAL CARE IF:  Any of the following symptoms occur over the next 12 hours:  Shaking chills.  You have a temperature by mouth above 102 F (38.9 C), not controlled by medicine.  Chest, back, or muscle pain.  People around you feel you are not acting correctly or are confused.  Shortness of breath or difficulty breathing.  Dizziness and fainting.  You get a rash or develop hives.  You have a decrease in urine output.  Your urine  turns a dark color or changes to pink, red, or brown. Any of the following symptoms occur over the next 10 days:  You have a temperature by mouth above 102 F (38.9 C), not controlled by medicine.  Shortness of breath.  Weakness after normal activity.  The white part of the eye turns yellow (jaundice).  You have a decrease in the amount of urine or are urinating less often.  Your urine turns a dark color or changes to pink, red, or brown. Document Released: 03/01/2000 Document Revised: 05/27/2011 Document Reviewed: 10/19/2007 [facility] Patient Information 2014 [place], [place].  _______________________________________________________________________

## 2019-02-09 NOTE — Progress Notes (Signed)
PCP - none Cardiologist - none  Chest x-ray - 06/29/2018 care everywhere EKG - 06/29/2018 care everywhere Stress Test -  ECHO -  Cardiac Cath -   Sleep Study -  CPAP -   Fasting Blood Sugar -  Checks Blood Sugar _____ times a day  Blood Thinner Instructions: Aspirin Instructions: Last Dose:  Anesthesia review:   Patient denies shortness of breath, fever, cough and chest pain at PAT appointment   Patient verbalized understanding of instructions that were given to them at the PAT appointment. Patient was also instructed that they will need to review over the PAT instructions again at home before surgery.

## 2019-02-13 ENCOUNTER — Other Ambulatory Visit (HOSPITAL_COMMUNITY)
Admission: RE | Admit: 2019-02-13 | Discharge: 2019-02-13 | Disposition: A | Discharge status: Home / Self Care | Payer: No Typology Code available for payment source | Source: Ambulatory Visit | Attending: Urology | Admitting: Urology

## 2019-02-13 DIAGNOSIS — Z01812 Encounter for preprocedural laboratory examination: Secondary | ICD-10-CM | POA: Diagnosis present

## 2019-02-13 DIAGNOSIS — Z20828 Contact with and (suspected) exposure to other viral communicable diseases: Secondary | ICD-10-CM | POA: Insufficient documentation

## 2019-02-14 LAB — NOVEL CORONAVIRUS, NAA (HOSP ORDER, SEND-OUT TO REF LAB; TAT 18-24 HRS): SARS-CoV-2, NAA: NOT DETECTED

## 2019-02-16 ENCOUNTER — Encounter (HOSPITAL_COMMUNITY)
Admission: RE | Admit: 2019-02-16 | Discharge: 2019-02-16 | Disposition: A | Discharge status: Home / Self Care | Payer: No Typology Code available for payment source | Source: Ambulatory Visit | Attending: Urology | Admitting: Urology

## 2019-02-16 ENCOUNTER — Encounter (HOSPITAL_COMMUNITY): Payer: Self-pay

## 2019-02-16 ENCOUNTER — Other Ambulatory Visit: Payer: Self-pay

## 2019-02-16 DIAGNOSIS — Z01812 Encounter for preprocedural laboratory examination: Secondary | ICD-10-CM | POA: Insufficient documentation

## 2019-02-16 LAB — COMPREHENSIVE METABOLIC PANEL
ALT: 12 U/L (ref 0–44)
AST: 20 U/L (ref 15–41)
Albumin: 4.6 g/dL (ref 3.5–5.0)
Alkaline Phosphatase: 34 U/L — ABNORMAL LOW (ref 38–126)
Anion gap: 6 (ref 5–15)
BUN: 17 mg/dL (ref 6–20)
CO2: 24 mmol/L (ref 22–32)
Calcium: 9.8 mg/dL (ref 8.9–10.3)
Chloride: 109 mmol/L (ref 98–111)
Creatinine, Ser: 1.18 mg/dL — ABNORMAL HIGH (ref 0.44–1.00)
GFR calc Af Amer: >60 mL/min (ref >60)
GFR calc non Af Amer: 59 mL/min — ABNORMAL LOW (ref >60)
Glucose, Bld: 87 mg/dL (ref 70–99)
Potassium: 3.7 mmol/L (ref 3.5–5.1)
Sodium: 139 mmol/L (ref 135–145)
Total Bilirubin: 1.7 mg/dL — ABNORMAL HIGH (ref 0.3–1.2)
Total Protein: 8.1 g/dL (ref 6.5–8.1)

## 2019-02-16 LAB — CBC
HCT: 42.4 % (ref 36.0–46.0)
Hemoglobin: 12.8 g/dL (ref 12.0–15.0)
MCH: 24 pg — ABNORMAL LOW (ref 26.0–34.0)
MCHC: 30.2 g/dL (ref 30.0–36.0)
MCV: 79.5 fL — ABNORMAL LOW (ref 80.0–100.0)
Platelets: 158 10*3/uL (ref 150–400)
RBC: 5.33 MIL/uL — ABNORMAL HIGH (ref 3.87–5.11)
RDW: 14.2 % (ref 11.5–15.5)
WBC: 3.5 10*3/uL — ABNORMAL LOW (ref 4.0–10.5)
nRBC: 0 % (ref 0.0–0.2)

## 2019-02-16 LAB — ABO/RH: ABO/RH(D): A POS

## 2019-02-17 ENCOUNTER — Ambulatory Visit (HOSPITAL_COMMUNITY): Payer: No Typology Code available for payment source | Admitting: Certified Registered Nurse Anesthetist

## 2019-02-17 ENCOUNTER — Encounter (HOSPITAL_COMMUNITY)
Admission: RE | Disposition: A | Discharge status: Home / Self Care | Payer: Self-pay | Source: Home / Self Care | Attending: Urology

## 2019-02-17 ENCOUNTER — Encounter (HOSPITAL_COMMUNITY): Payer: Self-pay | Admitting: *Deleted

## 2019-02-17 ENCOUNTER — Inpatient Hospital Stay (HOSPITAL_COMMUNITY)
Admission: RE | Admit: 2019-02-17 | Discharge: 2019-02-19 | DRG: 660 | Disposition: A | Discharge status: Home / Self Care | Payer: No Typology Code available for payment source | Attending: Urology | Admitting: Urology

## 2019-02-17 ENCOUNTER — Ambulatory Visit (HOSPITAL_COMMUNITY): Payer: No Typology Code available for payment source

## 2019-02-17 DIAGNOSIS — Z87891 Personal history of nicotine dependence: Secondary | ICD-10-CM

## 2019-02-17 DIAGNOSIS — D62 Acute posthemorrhagic anemia: Secondary | ICD-10-CM | POA: Diagnosis not present

## 2019-02-17 DIAGNOSIS — Z87442 Personal history of urinary calculi: Secondary | ICD-10-CM

## 2019-02-17 DIAGNOSIS — N200 Calculus of kidney: Principal | ICD-10-CM

## 2019-02-17 DIAGNOSIS — D860 Sarcoidosis of lung: Secondary | ICD-10-CM | POA: Diagnosis present

## 2019-02-17 HISTORY — PX: CYSTOSCOPY: SHX5120

## 2019-02-17 HISTORY — PX: NEPHROLITHOTOMY: SHX5134

## 2019-02-17 HISTORY — PX: HOLMIUM LASER APPLICATION: SHX5852

## 2019-02-17 LAB — BASIC METABOLIC PANEL
Anion gap: 7 (ref 5–15)
BUN: 14 mg/dL (ref 6–20)
CO2: 23 mmol/L (ref 22–32)
Calcium: 7.9 mg/dL — ABNORMAL LOW (ref 8.9–10.3)
Chloride: 111 mmol/L (ref 98–111)
Creatinine, Ser: 1.36 mg/dL — ABNORMAL HIGH (ref 0.44–1.00)
GFR calc Af Amer: 57 mL/min — ABNORMAL LOW (ref >60)
GFR calc non Af Amer: 50 mL/min — ABNORMAL LOW (ref >60)
Glucose, Bld: 152 mg/dL — ABNORMAL HIGH (ref 70–99)
Potassium: 4.1 mmol/L (ref 3.5–5.1)
Sodium: 141 mmol/L (ref 135–145)

## 2019-02-17 LAB — PREGNANCY, URINE: Preg Test, Ur: NEGATIVE

## 2019-02-17 LAB — CBC
HCT: 27.6 % — ABNORMAL LOW (ref 36.0–46.0)
Hemoglobin: 8.4 g/dL — ABNORMAL LOW (ref 12.0–15.0)
MCH: 24.9 pg — ABNORMAL LOW (ref 26.0–34.0)
MCHC: 30.4 g/dL (ref 30.0–36.0)
MCV: 81.9 fL (ref 80.0–100.0)
Platelets: 169 10*3/uL (ref 150–400)
RBC: 3.37 MIL/uL — ABNORMAL LOW (ref 3.87–5.11)
RDW: 14.3 % (ref 11.5–15.5)
WBC: 17.4 10*3/uL — ABNORMAL HIGH (ref 4.0–10.5)
nRBC: 0 % (ref 0.0–0.2)

## 2019-02-17 LAB — POCT I-STAT EG7
Acid-base deficit: 5 mmol/L — ABNORMAL HIGH (ref 0.0–2.0)
Bicarbonate: 22.4 mmol/L (ref 20.0–28.0)
Calcium, Ion: 1.23 mmol/L (ref 1.15–1.40)
HCT: 30 % — ABNORMAL LOW (ref 36.0–46.0)
Hemoglobin: 10.2 g/dL — ABNORMAL LOW (ref 12.0–15.0)
O2 Saturation: 58 %
Potassium: 4.4 mmol/L (ref 3.5–5.1)
Sodium: 141 mmol/L (ref 135–145)
TCO2: 24 mmol/L (ref 22–32)
pCO2, Ven: 48.9 mmHg (ref 44.0–60.0)
pH, Ven: 7.269 (ref 7.250–7.430)
pO2, Ven: 35 mmHg (ref 32.0–45.0)

## 2019-02-17 LAB — URINE CULTURE: Culture: NO GROWTH

## 2019-02-17 SURGERY — NEPHROLITHOTOMY PERCUTANEOUS
Anesthesia: General | Laterality: Right

## 2019-02-17 MED ORDER — SCOPOLAMINE 1 MG/3DAYS TD PT72
MEDICATED_PATCH | TRANSDERMAL | Status: AC
  Filled 2019-02-17: qty 1

## 2019-02-17 MED ORDER — HYDROMORPHONE HCL 1 MG/ML IJ SOLN
INTRAMUSCULAR | Status: DC | PRN
  Administered 2019-02-17: 0.5 mg via INTRAVENOUS

## 2019-02-17 MED ORDER — IOHEXOL 300 MG/ML  SOLN
INTRAMUSCULAR | Status: DC | PRN
  Administered 2019-02-17: 120 mL

## 2019-02-17 MED ORDER — MIDAZOLAM HCL 5 MG/5ML IJ SOLN
INTRAMUSCULAR | Status: DC | PRN
  Administered 2019-02-17: 2 mg via INTRAVENOUS

## 2019-02-17 MED ORDER — SCOPOLAMINE 1 MG/3DAYS TD PT72
MEDICATED_PATCH | TRANSDERMAL | Status: DC | PRN
  Administered 2019-02-17: 1 via TRANSDERMAL

## 2019-02-17 MED ORDER — ROCURONIUM BROMIDE 50 MG/5ML IV SOSY
PREFILLED_SYRINGE | INTRAVENOUS | Status: DC | PRN
  Administered 2019-02-17: 20 mg via INTRAVENOUS
  Administered 2019-02-17: 10 mg via INTRAVENOUS
  Administered 2019-02-17: 60 mg via INTRAVENOUS
  Administered 2019-02-17: 10 mg via INTRAVENOUS
  Administered 2019-02-17: 20 mg via INTRAVENOUS

## 2019-02-17 MED ORDER — MIDAZOLAM HCL 2 MG/2ML IJ SOLN
INTRAMUSCULAR | Status: AC
  Filled 2019-02-17: qty 2

## 2019-02-17 MED ORDER — LIDOCAINE 2% (20 MG/ML) 5 ML SYRINGE
INTRAMUSCULAR | Status: AC
  Filled 2019-02-17: qty 5

## 2019-02-17 MED ORDER — ACETAMINOPHEN 10 MG/ML IV SOLN
INTRAVENOUS | Status: AC
  Filled 2019-02-17: qty 100

## 2019-02-17 MED ORDER — LACTATED RINGERS IV SOLN
INTRAVENOUS | Status: DC
  Administered 2019-02-17 – 2019-02-18 (×3): via INTRAVENOUS

## 2019-02-17 MED ORDER — BUPIVACAINE HCL (PF) 0.5 % IJ SOLN
INTRAMUSCULAR | Status: AC
  Filled 2019-02-17: qty 30

## 2019-02-17 MED ORDER — DEXAMETHASONE SODIUM PHOSPHATE 10 MG/ML IJ SOLN
INTRAMUSCULAR | Status: AC
  Filled 2019-02-17: qty 1

## 2019-02-17 MED ORDER — LIDOCAINE 2% (20 MG/ML) 5 ML SYRINGE
INTRAMUSCULAR | Status: DC | PRN
  Administered 2019-02-17: 100 mg via INTRAVENOUS

## 2019-02-17 MED ORDER — FENTANYL CITRATE (PF) 100 MCG/2ML IJ SOLN
INTRAMUSCULAR | Status: DC | PRN
  Administered 2019-02-17: 100 ug via INTRAVENOUS
  Administered 2019-02-17 (×3): 50 ug via INTRAVENOUS

## 2019-02-17 MED ORDER — SODIUM CHLORIDE 0.9 % IV SOLN
Freq: Once | INTRAVENOUS | Status: AC
  Administered 2019-02-17: via INTRAVENOUS

## 2019-02-17 MED ORDER — DEXAMETHASONE SODIUM PHOSPHATE 10 MG/ML IJ SOLN
INTRAMUSCULAR | Status: DC | PRN
  Administered 2019-02-17: 8 mg via INTRAVENOUS

## 2019-02-17 MED ORDER — FENTANYL CITRATE (PF) 250 MCG/5ML IJ SOLN
INTRAMUSCULAR | Status: AC
  Filled 2019-02-17: qty 5

## 2019-02-17 MED ORDER — ONDANSETRON HCL 4 MG/2ML IJ SOLN
INTRAMUSCULAR | Status: AC
  Filled 2019-02-17: qty 2

## 2019-02-17 MED ORDER — EPHEDRINE 5 MG/ML INJ
INTRAVENOUS | Status: AC
  Filled 2019-02-17: qty 10

## 2019-02-17 MED ORDER — ALBUMIN HUMAN 5 % IV SOLN
12.5000 g | Freq: Once | INTRAVENOUS | Status: AC
  Administered 2019-02-17: 22:00:00 12.5 g via INTRAVENOUS

## 2019-02-17 MED ORDER — PROMETHAZINE HCL 25 MG/ML IJ SOLN
6.2500 mg | INTRAMUSCULAR | Status: DC | PRN
  Administered 2019-02-17: 21:00:00 6.25 mg via INTRAVENOUS

## 2019-02-17 MED ORDER — HYDROMORPHONE HCL 2 MG/ML IJ SOLN
INTRAMUSCULAR | Status: AC
  Filled 2019-02-17: qty 1

## 2019-02-17 MED ORDER — HYDROMORPHONE HCL 1 MG/ML IJ SOLN: 0.2500 mg | INTRAMUSCULAR | Status: DC | PRN

## 2019-02-17 MED ORDER — PHENYLEPHRINE 40 MCG/ML (10ML) SYRINGE FOR IV PUSH (FOR BLOOD PRESSURE SUPPORT)
PREFILLED_SYRINGE | INTRAVENOUS | Status: DC | PRN
  Administered 2019-02-17 (×10): 80 ug via INTRAVENOUS

## 2019-02-17 MED ORDER — PHENYLEPHRINE HCL (PRESSORS) 10 MG/ML IV SOLN
INTRAVENOUS | Status: AC
  Filled 2019-02-17: qty 1

## 2019-02-17 MED ORDER — ALBUMIN HUMAN 5 % IV SOLN
INTRAVENOUS | Status: AC
  Filled 2019-02-17: qty 250

## 2019-02-17 MED ORDER — PHENYLEPHRINE HCL-NACL 10-0.9 MG/250ML-% IV SOLN: 0.0000 ug/min | INTRAVENOUS | Status: DC

## 2019-02-17 MED ORDER — PHENYLEPHRINE HCL-NACL 10-0.9 MG/250ML-% IV SOLN
INTRAVENOUS | Status: DC | PRN
  Administered 2019-02-17: 75 ug/min via INTRAVENOUS

## 2019-02-17 MED ORDER — PROMETHAZINE HCL 25 MG/ML IJ SOLN
INTRAMUSCULAR | Status: AC
  Filled 2019-02-17: qty 1

## 2019-02-17 MED ORDER — PROPOFOL 10 MG/ML IV BOLUS
INTRAVENOUS | Status: AC
  Filled 2019-02-17: qty 20

## 2019-02-17 MED ORDER — PROPOFOL 10 MG/ML IV BOLUS
INTRAVENOUS | Status: DC | PRN
  Administered 2019-02-17: 150 mg via INTRAVENOUS

## 2019-02-17 MED ORDER — BUPIVACAINE-EPINEPHRINE 0.25% -1:200000 IJ SOLN
INTRAMUSCULAR | Status: AC
  Filled 2019-02-17: qty 1

## 2019-02-17 MED ORDER — PHENYLEPHRINE 40 MCG/ML (10ML) SYRINGE FOR IV PUSH (FOR BLOOD PRESSURE SUPPORT)
PREFILLED_SYRINGE | INTRAVENOUS | Status: AC
  Filled 2019-02-17: qty 10

## 2019-02-17 MED ORDER — EPHEDRINE SULFATE-NACL 50-0.9 MG/10ML-% IV SOSY
PREFILLED_SYRINGE | INTRAVENOUS | Status: DC | PRN
  Administered 2019-02-17: 5 mg via INTRAVENOUS

## 2019-02-17 MED ORDER — MAGNESIUM CITRATE PO SOLN: 1.0000 | Freq: Once | ORAL | Status: DC

## 2019-02-17 MED ORDER — ALBUMIN HUMAN 5 % IV SOLN
INTRAVENOUS | Status: DC | PRN
  Administered 2019-02-17: 16:00:00 via INTRAVENOUS

## 2019-02-17 MED ORDER — BUPIVACAINE-EPINEPHRINE 0.25% -1:200000 IJ SOLN
INTRAMUSCULAR | Status: DC | PRN
  Administered 2019-02-17: 45 mL

## 2019-02-17 MED ORDER — MEPERIDINE HCL 50 MG/ML IJ SOLN: 6.2500 mg | INTRAMUSCULAR | Status: DC | PRN

## 2019-02-17 MED ORDER — SUGAMMADEX SODIUM 200 MG/2ML IV SOLN
INTRAVENOUS | Status: DC | PRN
  Administered 2019-02-17: 175 mg via INTRAVENOUS

## 2019-02-17 MED ORDER — 0.9 % SODIUM CHLORIDE (POUR BTL) OPTIME
TOPICAL | Status: DC | PRN
  Administered 2019-02-17: 1000 mL

## 2019-02-17 MED ORDER — ACETAMINOPHEN 10 MG/ML IV SOLN
1000.0000 mg | Freq: Once | INTRAVENOUS | Status: DC | PRN
  Administered 2019-02-17: 19:00:00 1000 mg via INTRAVENOUS

## 2019-02-17 MED ORDER — SODIUM CHLORIDE 0.9 % IR SOLN
Status: DC | PRN
  Administered 2019-02-17: 50000 mL

## 2019-02-17 MED ORDER — ONDANSETRON HCL 4 MG/2ML IJ SOLN
INTRAMUSCULAR | Status: DC | PRN
  Administered 2019-02-17: 4 mg via INTRAVENOUS

## 2019-02-17 MED ORDER — ROCURONIUM BROMIDE 10 MG/ML (PF) SYRINGE
PREFILLED_SYRINGE | INTRAVENOUS | Status: AC
  Filled 2019-02-17: qty 10

## 2019-02-17 MED ORDER — CEFAZOLIN SODIUM-DEXTROSE 2-4 GM/100ML-% IV SOLN
2.0000 g | INTRAVENOUS | Status: AC
  Administered 2019-02-17: 13:00:00 2 g via INTRAVENOUS
  Filled 2019-02-17: qty 100

## 2019-02-17 SURGICAL SUPPLY — 66 items
BAG URINE DRAIN 2000ML AR STRL (UROLOGICAL SUPPLIES) ×6 IMPLANT
BASKET STONE NCOMPASS (UROLOGICAL SUPPLIES) IMPLANT
BASKET ZERO TIP NITINOL 2.4FR (BASKET) ×2 IMPLANT
BENZOIN TINCTURE PRP APPL 2/3 (GAUZE/BANDAGES/DRESSINGS) ×4 IMPLANT
BLADE SURG 15 STRL LF DISP TIS (BLADE) ×2 IMPLANT
BLADE SURG 15 STRL SS (BLADE) ×2
CATH AINSWORTH 30CC 24FR (CATHETERS) ×2 IMPLANT
CATH FOLEY 2WAY SLVR  5CC 16FR (CATHETERS) ×2
CATH FOLEY 2WAY SLVR 5CC 16FR (CATHETERS) ×2 IMPLANT
CATH IMAGER II 65CM (CATHETERS) ×2 IMPLANT
CATH URET 5FR 28IN OPEN ENDED (CATHETERS) ×4 IMPLANT
CATH URET DUAL LUMEN 6-10FR 50 (CATHETERS) ×4 IMPLANT
CATH UROLOGY TORQUE 40 (MISCELLANEOUS) ×2 IMPLANT
CATH X-FORCE N30 NEPHROSTOMY (TUBING) ×4 IMPLANT
CHLORAPREP W/TINT 26 (MISCELLANEOUS) ×4 IMPLANT
COVER SURGICAL LIGHT HANDLE (MISCELLANEOUS) ×2 IMPLANT
COVER WAND RF STERILE (DRAPES) IMPLANT
DRAPE C-ARM 42X120 X-RAY (DRAPES) ×4 IMPLANT
DRAPE LINGEMAN PERC (DRAPES) ×4 IMPLANT
DRAPE SURG IRRIG POUCH 19X23 (DRAPES) ×2 IMPLANT
DRSG PAD ABDOMINAL 8X10 ST (GAUZE/BANDAGES/DRESSINGS) ×8 IMPLANT
DRSG TEGADERM 4X4.75 (GAUZE/BANDAGES/DRESSINGS) ×4 IMPLANT
DRSG TEGADERM 8X12 (GAUZE/BANDAGES/DRESSINGS) ×10 IMPLANT
EXTRACTOR STONE 1.7FRX115CM (UROLOGICAL SUPPLIES) IMPLANT
EXTRACTOR STONE PERC NCIRCLE (MISCELLANEOUS) ×2 IMPLANT
FIBER LASER FLEXIVA 365 (UROLOGICAL SUPPLIES) IMPLANT
FIBER LASER TRAC TIP (UROLOGICAL SUPPLIES) IMPLANT
GAUZE SPONGE 4X4 12PLY STRL (GAUZE/BANDAGES/DRESSINGS) ×4 IMPLANT
GLOVE BIOGEL M STRL SZ7.5 (GLOVE) ×4 IMPLANT
GOWN STRL REUS W/TWL XL LVL3 (GOWN DISPOSABLE) ×4 IMPLANT
GUIDEWIRE AMPLAZ .035X145 (WIRE) ×8 IMPLANT
GUIDEWIRE ANG ZIPWIRE 038X150 (WIRE) ×2 IMPLANT
GUIDEWIRE STR DUAL SENSOR (WIRE) ×6 IMPLANT
HLDR NDL AMPLATZ W/INSERTS (MISCELLANEOUS) IMPLANT
HOLDER FOLEY CATH W/STRAP (MISCELLANEOUS) ×2 IMPLANT
HOLDER NEEDLE AMPLATZ W/INSERT (MISCELLANEOUS) IMPLANT
IV SET EXTENSION CATH 6 NF (IV SETS) ×4 IMPLANT
KIT BASIN OR (CUSTOM PROCEDURE TRAY) ×4 IMPLANT
KIT PROBE 340X3.4XDISP GRN (MISCELLANEOUS) IMPLANT
KIT PROBE TRILOGY 3.4X340 (MISCELLANEOUS)
KIT PROBE TRILOGY 3.9X350 (MISCELLANEOUS) ×2 IMPLANT
KIT TURNOVER KIT A (KITS) ×2 IMPLANT
MANIFOLD NEPTUNE II (INSTRUMENTS) ×4 IMPLANT
NDL SPNL 20GX3.5 QUINCKE YW (NEEDLE) IMPLANT
NDL TROCAR 18X15 ECHO (NEEDLE) IMPLANT
NDL TROCAR 18X20 (NEEDLE) IMPLANT
NEEDLE SPNL 20GX3.5 QUINCKE YW (NEEDLE) ×4 IMPLANT
NEEDLE TROCAR 18X15 ECHO (NEEDLE) ×4 IMPLANT
NEEDLE TROCAR 18X20 (NEEDLE) IMPLANT
NS IRRIG 1000ML POUR BTL (IV SOLUTION) ×2 IMPLANT
PACK CYSTO (CUSTOM PROCEDURE TRAY) ×4 IMPLANT
PAD POSITIONING PINK XL (MISCELLANEOUS) ×2 IMPLANT
SHEATH PEELAWAY SET 9 (SHEATH) IMPLANT
SPONGE LAP 4X18 RFD (DISPOSABLE) ×4 IMPLANT
STENT URET 6FRX24 CONTOUR (STENTS) ×2 IMPLANT
SUT ETHILON 3 0 PS 1 (SUTURE) ×4 IMPLANT
SYR 10ML LL (SYRINGE) ×4 IMPLANT
SYR 20ML LL LF (SYRINGE) ×8 IMPLANT
SYR 50ML LL SCALE MARK (SYRINGE) ×4 IMPLANT
TOWEL OR 17X26 10 PK STRL BLUE (TOWEL DISPOSABLE) ×4 IMPLANT
TUBING CONNECTING 10 (TUBING) ×6 IMPLANT
TUBING CONNECTING 10' (TUBING) ×2
TUBING STONE CATCHER TRILOGY (MISCELLANEOUS) ×2 IMPLANT
TUBING UROLOGY SET (TUBING) ×4 IMPLANT
UNDERPAD 30X36 HEAVY ABSORB (UNDERPADS AND DIAPERS) ×4 IMPLANT
WATER STERILE IRR 500ML POUR (IV SOLUTION) ×2 IMPLANT

## 2019-02-17 NOTE — Op Note (Signed)
Pre-operative diagnosis: right sided nephrolithiasis, >2.0 centimeters  Post-operative diagnosis: as above   Procedure performed:  cystoscopy, right retrograde pyelogram with interpretation, right percutaneous renal access, right nephrolithotomy, right nephrostogram,   right ureteral stent placement, right nephrostomy tube placement.  Surgeon: Dr. Benjamin W. Herrick  Assistant: none  Anesthesia: General  Complications: None  Specimens: Several small fragments of the stone extracted will be sent to [facility] for stone composition analysis  Findings: #1: Lower pole access was obtained and the branch stone in the lower and midpole calyces was largely removed in its entirety.  There were some remaining large stone fragments in the upper pole at the end of the case. #2: Retrograde pyelogram was performed with the patient in the prone position using a 5 French abated ureteral catheter demonstrating a normal caliber ureter and the hydroureter nephrosis.  There was a large filling defect consistent with the patient's known staghorn calculus with no other significant abnormalities. #3: At the end of the case a completion antegrade pyelogram was performed demonstrating near complete resolution of her filling defect and no evidence of contrast extravasation or calyceal rupture.  EBL: Approximately 500 cc  Specimens: stone from collecting system - taken to [facility] lab  Indication: Indication: Kara Atkinson is a 38 y.o.  patient with  a large burden right nephrolithiasis and a history of cystine stones. The patient presented today for follow-up definitive management. After  reviewing the management options for treatment, elected to proceed with the above surgical procedure(s). We have discussed the potential benefits and risks of the procedure, side effects of the proposed treatment, the likelihood of the patient achieving the goals of the procedure, and any potential  problems that might occur during the procedure or recuperation. Informed consent has been obtained.   Description:  Consent was obtained in the preoperative holding area. The patient was marked appropriately and then taken back to the operating room where they were intubated on the gurney. The patient was flipped prone onto the OR table. Large jelly rolls were placed in the anterior axillary line on both sides allowing the patient's chest and abdomen to fall inbetween. The patient was then prepped and draped in the routine sterile fashion in the right flank. A timeout was then held confirming the proper side and procedure as well as antibiotics were administered.  I then used the flexible cystoscope and passed gently into the patient's urethra under visual guidance. Once into the bladder I grasped the stent emanating from the patient's right ureteral orifice and pull it to the urethral meatus. I then passed a wire through the stent and up into the right collecting system. I then removed the stent over the wire and exchanged for a 5 French open-ended ureteral Pollock catheter. The Pollack catheter was then advanced up to the UPJ. The wire was then removed and a retrograde pyelogram was performed with the above findings. I then turned my attention to the patient's right flank and obtaining percutaneous renal access.  Using the C-arm rotated at 25 and the bulls-eye technique with an 18-gauge coaxial needle the  upper/lower posterior lateral calyx was targeted. Then rotating the C-arm AP depth of our needle was noted to be within the calyx and the inner part of the coaxial needle was removed. Urine was noted to return. A 0.038 sensor wire was then passed through the sheath of the coaxial needle and into the right renal collecting system. The wire was then passed down the ureter and into   the bladder using fluoroscopic guide and the sheath of the needle was removed. An angiographic catheter was then advanced  into the bladder and the wire removed. A Super Stiff wire was then passed into the angiographic catheter and the angiographic catheter removed. A 9 French peel-away dilator was then advanced over the Super Stiff wire and passed into the renal pelvis and across the UPJ under fluoroscopic guidance. The inner part of this dilator was removed and the 0.38 sensor wire was passed alongside the Super Stiff wire through the dilator and into the right ureter and down into the bladder. The angiographic catheter again was passed over the guidewire and advanced into the bladder, the wire was then removed. A Super Stiff wire was then passed through angiographic catheter and angiographic catheter removed. The outer part of the sheath was then removed, establishing 2 superstiff wires through the targeted calyx and into the bladder.   The 30 French NephroMax balloon was then passed over one of the Super Stiff wires and the tip guided down into the targeted calyx. The balloon was then inflated to approximately 12 atm, and once there was no waist noted under fluoroscopy the access sheath was advanced over the balloon. The balloon was then removed. The wires were then placed back into the sheaths and snapped to the drape.   Using the rigid nephroscope to explore the targeted calyx and kidney.  The stones were then evacuated using the Triton pneumatic and ultrasound device.  The bright stone in the lower and midpole calyces was nearly completely removed.  There was some difficulty getting into the lower pole given the angle.  I used the cystoscope and had to laser this branch of the stone in the lower pole before being able to evacuate using the Trident.  Then using a flexible cystoscope to navigate the remaining calyces of the kidney multiple smaller stone fragments encountered.  Using the 0 tip basket these fragments were grabbed and removed.  I then ventured into the upper pole and there was again another stone nearly  encompassing the entire calyx.  I fragmented this into numerous small stones and was able to pull some of these down into the renal pelvis and then evacuated with the Triton.  Several large fragments did remain, with the plan to stage this procedure and performed ureteroscopy in the coming weeks.    I then advanced the sensor wire through the rigid nephroscope and advanced it down to the bladder under fluoroscopic guidance.  The sensor wire was then backloaded over the rigid nephroscope using the stent pusher and a  24 cm x 6 French double-J ureteral stent was passed antegrade over the sensor wire down into the bladder under fluoroscopic guidance. Once the stent was in the bladder the wire was gently pulled back and a nice curl noted in the bladder. The wire completely removed from the stent, and nice curl on the proximal end of the stent was noted in the renal pelvis. The sheath was then backed out slowly to ensure that all calyces had been inspected and there was nothing behind the sheath.   A 24F ainsworth tip catheter was then passed over one of the Super Stiff wires through the sheath and into the renal pelvis. The sheath was then backed out of the kidney and cut over the red rubber catheter. A nephrostogram was then performed confirming the position of our nephrostomy tube and reassuring that there were no longer any filling defects from the patient's symptoms.     As such, I remove the nephrostomy tube as well as the safety wire. 25 cc of local anesthesia was then injected into the patient's wound, and the wound was closed with 3-0 nylon in 2 vertical mattress sutures. The incision was then padded using a bundle of 4 x 4's and Hypafix tape. Patient was subsequently rolled over to the supine position and extubated. The patient was returned to the PACU in excellent condition. At the end of the case all lap and needle and sponges were accounted for. There are no perioperative complications.  

## 2019-02-17 NOTE — Anesthesia Postprocedure Evaluation (Signed)
Anesthesia Post Note  Patient: Aleigha Gilani  Procedure(s) Performed: NEPHROLITHOTOMY PERCUTANEOUS WITH SURGEON ACCESS (Right ) HOLMIUM LASER APPLICATION (Right ) CYSTOSCOPY FLEXIBLE (N/A )     Patient location during evaluation: PACU Anesthesia Type: General Level of consciousness: awake and alert Pain management: pain level controlled Vital Signs Assessment: post-procedure vital signs reviewed and stable Respiratory status: spontaneous breathing, nonlabored ventilation and respiratory function stable Cardiovascular status: blood pressure returned to baseline and stable Postop Assessment: no apparent nausea or vomiting Anesthetic complications: no    Last Vitals:  Vitals:   02/17/19 1845 02/17/19 1900  BP: (!) 84/51 (!) 90/53  Pulse: 83 87  Resp: 18 19  Temp:    SpO2: 100% 100%    Last Pain:  Vitals:   02/17/19 1900  TempSrc:   PainSc: 0-No pain                 Lynda Rainwater

## 2019-02-17 NOTE — Transfer of Care (Signed)
Immediate Anesthesia Transfer of Care Note  Patient: Kara Atkinson  Procedure(s) Performed: Procedure(s): NEPHROLITHOTOMY PERCUTANEOUS WITH SURGEON ACCESS (Right) HOLMIUM LASER APPLICATION (Right) CYSTOSCOPY FLEXIBLE (N/A)  Patient Location: PACU  Anesthesia Type:General  Level of Consciousness:  sedated, patient cooperative and responds to stimulation  Airway & Oxygen Therapy:Patient Spontanous Breathing and Patient connected to face mask oxgen  Post-op Assessment:  Report given to PACU RN and Post -op Vital signs reviewed and stable  Post vital signs:  Reviewed and stable  Last Vitals:  Vitals:   02/17/19 0812  BP: 104/71  Pulse: 81  Resp: 18  Temp: 36.9 C  SpO2: 542%    Complications: No apparent anesthesia complications

## 2019-02-17 NOTE — Anesthesia Preprocedure Evaluation (Signed)
Anesthesia Evaluation  Patient identified by MRN, date of birth, ID band Patient awake    Reviewed: Allergy & Precautions, NPO status , Patient's Chart, lab work & pertinent test results  Airway Mallampati: I       Dental no notable dental hx. (+) Teeth Intact   Pulmonary former smoker,    Pulmonary exam normal breath sounds clear to auscultation       Cardiovascular negative cardio ROS Normal cardiovascular exam Rhythm:Regular Rate:Normal     Neuro/Psych negative neurological ROS  negative psych ROS   GI/Hepatic negative GI ROS, Neg liver ROS,   Endo/Other  G6PD  Renal/GU      Musculoskeletal negative musculoskeletal ROS (+)   Abdominal Normal abdominal exam  (+)   Peds  Hematology negative hematology ROS (+)   Anesthesia Other Findings   Reproductive/Obstetrics negative OB ROS                             Anesthesia Physical Anesthesia Plan  ASA: II  Anesthesia Plan: General   Post-op Pain Management:    Induction: Intravenous  PONV Risk Score and Plan: 4 or greater and Ondansetron, Dexamethasone, Midazolam and Scopolamine patch - Pre-op  Airway Management Planned: Oral ETT  Additional Equipment: None  Intra-op Plan:   Post-operative Plan: Extubation in OR  Informed Consent: I have reviewed the patients History and Physical, chart, labs and discussed the procedure including the risks, benefits and alternatives for the proposed anesthesia with the patient or authorized representative who has indicated his/her understanding and acceptance.     Dental advisory given  Plan Discussed with: CRNA  Anesthesia Plan Comments:         Anesthesia Quick Evaluation

## 2019-02-17 NOTE — Anesthesia Procedure Notes (Signed)
Procedure Name: Intubation Date/Time: 02/17/2019 12:33 PM Performed by: Montel Clock, CRNA Pre-anesthesia Checklist: Patient identified, Emergency Drugs available, Suction available, Patient being monitored and Timeout performed Patient Re-evaluated:Patient Re-evaluated prior to induction Oxygen Delivery Method: Circle system utilized Preoxygenation: Pre-oxygenation with 100% oxygen Induction Type: IV induction Ventilation: Mask ventilation without difficulty Laryngoscope Size: Mac and 3 Grade View: Grade I Tube type: Oral Tube size: 7.0 mm Number of attempts: 1 Airway Equipment and Method: Stylet Placement Confirmation: ETT inserted through vocal cords under direct vision,  positive ETCO2 and breath sounds checked- equal and bilateral Secured at: 21 cm Tube secured with: Tape Dental Injury: Teeth and Oropharynx as per pre-operative assessment

## 2019-02-17 NOTE — H&P (Signed)
38 year old female seen today for evaluation of gross hematuria. This happened had any other symptoms. 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 She had upper respiratory symptoms in 2019 and underwent chest x-ray which demonstrated a large branching stone in her right kidney. Her last CT scan was in February of 2013 which demonstrated a fairly large lower pole stone.   The patient states that she had gross hematuria for for 5 days several weeks ago. She has passed many stones in the past, and has never had hematuria associated with passage of stone. Her last episode of flank pain while passing his stone was several years ago. She was treated last for her cyst seen stones in [place] at [facility]. This was in 2008. Since then she has not had any surgery or treatment. She is not taking anything for her stones. She does attempt to drink 2 L of water daily.   The patient also has a history of sarcoidosis.   The patient has no family history of GU malignancy. She also does not have any history of cystine stones within her family.   Interval: Patient returns today for follow-up. She has undergone a CT scan, hematuria protocol. This demonstrated a large staghorn stone in the right kidney. Fortunately there is no stones on the left side.     ALLERGIES: None   MEDICATIONS: Vitamin D     GU PSH: Locm 300-399Mg /Ml Iodine,1Ml - 01/07/2019     NON-GU PSH: Remove Kidney Stone, 2009     GU PMH: Gross hematuria - 12/29/2018 Renal calculus - 12/29/2018      PMH Notes: Hx of kidney stones   NON-GU PMH: Anxiety    FAMILY HISTORY: 2 daughters - Daughter Diabetes - Grandmother   SOCIAL HISTORY: Marital Status: Married Race: Black or African American Current Smoking Status: Patient does not smoke anymore. Has not smoked since 12/16/2008. Smoked for 10 years. Smoked 1 pack per day.   Tobacco Use Assessment Completed: Used Tobacco in last 30  days? Does not drink anymore.  Does not drink caffeine.    REVIEW OF SYSTEMS:    GU Review Female:   Patient reports frequent urination and get up at night to urinate. Patient denies hard to postpone urination, burning /pain with urination, leakage of urine, stream starts and stops, trouble starting your stream, have to strain to urinate, and being pregnant.  Gastrointestinal (Upper):   Patient denies nausea, vomiting, and indigestion/ heartburn.  Gastrointestinal (Lower):   Patient denies diarrhea and constipation.  Constitutional:   Patient denies fever, night sweats, weight loss, and fatigue.  Skin:   Patient denies skin rash/ lesion and itching.  Eyes:   Patient denies blurred vision and double vision.  Ears/ Nose/ Throat:   Patient denies sore throat and sinus problems.  Hematologic/Lymphatic:   Patient denies swollen glands and easy bruising.  Cardiovascular:   Patient denies leg swelling and chest pains.  Respiratory:   Patient denies cough and shortness of breath.  Endocrine:   Patient denies excessive thirst.  Musculoskeletal:   Patient denies back pain and joint pain.  Neurological:   Patient denies headaches and dizziness.  Psychologic:   Patient denies depression and anxiety.   Notes: Patient urinates 2x during the night.    VITAL SIGNS:      01/14/2019 01:36 PM  Weight 173 lb / 78.47 kg  Height 64 in / 162.56 cm  BP 118/71 mmHg  Heart  Rate 91 /min  Temperature 97.8 F / 36.5 C  BMI 29.7 kg/m   MULTI-SYSTEM PHYSICAL EXAMINATION:    Constitutional: Well-nourished. No physical deformities. Normally developed. Good grooming.  Neck: Neck symmetrical, not swollen. Normal tracheal position.  Respiratory: Normal breath sounds. No labored breathing, no use of accessory muscles.   Cardiovascular: Regular rate and rhythm. No murmur, no gallop. Normal temperature, normal extremity pulses, no swelling, no varicosities.   Gastrointestinal: No mass, no tenderness, no rigidity, non  obese abdomen.  Musculoskeletal: Normal gait and station of head and neck.     PAST DATA REVIEWED:  Source Of History:  Patient  Records Review:   Previous Doctor Records, Previous Patient Records, POC Tool  X-Ray Review: C.T. Abdomen/Pelvis: Reviewed Films. Discussed With Patient.     PROCEDURES:          Urinalysis w/Scope Dipstick Dipstick Cont'd Micro  Color: Yellow Bilirubin: Neg mg/dL WBC/hpf: 10 - 07/MAU  Appearance: Clear Ketones: Neg mg/dL RBC/hpf: 3 - 63/FHL  Specific Gravity: 1.015 Blood: 1+ ery/uL Bacteria: Few (10-25/hpf)  pH: 7.0 Protein: 1+ mg/dL Cystals: NS (Not Seen)  Glucose: Neg mg/dL Urobilinogen: 0.2 mg/dL Casts: NS (Not Seen)    Nitrites: Neg Trichomonas: Not Present    Leukocyte Esterase: 3+ leu/uL Mucous: Not Present      Epithelial Cells: 0 - 5/hpf      Yeast: NS (Not Seen)      Sperm: Not Present    ASSESSMENT:      ICD-10 Details  1 GU:   Renal calculus - N20.0    PLAN:           Schedule Return Visit/Planned Activity: ASAP - Schedule Surgery          Document Letter(s):  Created for Patient: Clinical Summary    After going over the treatment options for their large stone burden I recommended that we proceed with percutaneous nephrolithotomy (PNCL). I then went over this surgery in significant detail. I discussed the fact that the patient would be prone, and explained the renal access component. We then discussed the stone removal process as well as a postprocedure stent placement. I discussed the risks of this operation which predominantly include bleeding and damage to the surrounding structures. I also described for him the possibility of difficulty obtaining access which may require an additional surgery. I explained expected complications such as pain, bleeding and need for additional procedures. We also discussed the potential to require a blood transfusion. After going through surgery, potential complications, and expected outcome, the patient  has agreed to proceed with the operation.        Notes:   The patient has a history of cystine stones.   The patient has nearly a complete staghorn calculus in the right kidney. I went over her CT scan with her in detail. I recommended that we do a PCNL. It will likely include multiple access points. She will also likely need a second-look procedure. Whether this is another PCNL or ureteroscopy remains to be seen in will depend on the months of progress were able to make on the initial surgery. I went through all this with the patient in detail. She was able to ask questions. Once all her questions were answered to her satisfaction she opted to proceed.   Once the patient has come through the surgery and she has been team stone free we will work on a metabolic evaluation and prevention in the future.

## 2019-02-18 ENCOUNTER — Observation Stay (HOSPITAL_COMMUNITY): Payer: No Typology Code available for payment source

## 2019-02-18 ENCOUNTER — Encounter (HOSPITAL_COMMUNITY): Payer: Self-pay | Admitting: Urology

## 2019-02-18 DIAGNOSIS — N200 Calculus of kidney: Secondary | ICD-10-CM | POA: Diagnosis present

## 2019-02-18 DIAGNOSIS — Z87442 Personal history of urinary calculi: Secondary | ICD-10-CM | POA: Diagnosis not present

## 2019-02-18 DIAGNOSIS — Z87891 Personal history of nicotine dependence: Secondary | ICD-10-CM | POA: Diagnosis not present

## 2019-02-18 DIAGNOSIS — D860 Sarcoidosis of lung: Secondary | ICD-10-CM | POA: Diagnosis present

## 2019-02-18 DIAGNOSIS — D62 Acute posthemorrhagic anemia: Secondary | ICD-10-CM | POA: Diagnosis not present

## 2019-02-18 LAB — CBC
HCT: 20.5 % — ABNORMAL LOW (ref 36.0–46.0)
Hemoglobin: 6.1 g/dL — CL (ref 12.0–15.0)
MCH: 24.5 pg — ABNORMAL LOW (ref 26.0–34.0)
MCHC: 29.8 g/dL — ABNORMAL LOW (ref 30.0–36.0)
MCV: 82.3 fL (ref 80.0–100.0)
Platelets: 126 10*3/uL — ABNORMAL LOW (ref 150–400)
RBC: 2.49 MIL/uL — ABNORMAL LOW (ref 3.87–5.11)
RDW: 14.2 % (ref 11.5–15.5)
WBC: 13.5 10*3/uL — ABNORMAL HIGH (ref 4.0–10.5)
nRBC: 0 % (ref 0.0–0.2)

## 2019-02-18 LAB — PREPARE RBC (CROSSMATCH)

## 2019-02-18 LAB — BASIC METABOLIC PANEL
Anion gap: 8 (ref 5–15)
BUN: 16 mg/dL (ref 6–20)
CO2: 21 mmol/L — ABNORMAL LOW (ref 22–32)
Calcium: 7.7 mg/dL — ABNORMAL LOW (ref 8.9–10.3)
Chloride: 107 mmol/L (ref 98–111)
Creatinine, Ser: 1.36 mg/dL — ABNORMAL HIGH (ref 0.44–1.00)
GFR calc Af Amer: 57 mL/min — ABNORMAL LOW (ref >60)
GFR calc non Af Amer: 50 mL/min — ABNORMAL LOW (ref >60)
Glucose, Bld: 132 mg/dL — ABNORMAL HIGH (ref 70–99)
Potassium: 4.6 mmol/L (ref 3.5–5.1)
Sodium: 136 mmol/L (ref 135–145)

## 2019-02-18 LAB — HEMOGLOBIN AND HEMATOCRIT, BLOOD
HCT: 18.8 % — ABNORMAL LOW (ref 36.0–46.0)
HCT: 24.8 % — ABNORMAL LOW (ref 36.0–46.0)
Hemoglobin: 5.7 g/dL — CL (ref 12.0–15.0)
Hemoglobin: 8 g/dL — ABNORMAL LOW (ref 12.0–15.0)

## 2019-02-18 MED ORDER — OXYCODONE HCL 5 MG PO TABS
5.0000 mg | ORAL_TABLET | ORAL | Status: DC | PRN
  Administered 2019-02-18 – 2019-02-19 (×3): 5 mg via ORAL
  Filled 2019-02-18 (×4): qty 1

## 2019-02-18 MED ORDER — PROCHLORPERAZINE EDISYLATE 10 MG/2ML IJ SOLN: 10.0000 mg | Freq: Four times a day (QID) | INTRAMUSCULAR | Status: DC | PRN

## 2019-02-18 MED ORDER — CHLORHEXIDINE GLUCONATE CLOTH 2 % EX PADS
6.0000 | MEDICATED_PAD | Freq: Every day | CUTANEOUS | Status: DC
  Administered 2019-02-18: 10:00:00 6 via TOPICAL

## 2019-02-18 MED ORDER — HYDROMORPHONE HCL 1 MG/ML IJ SOLN: 0.5000 mg | INTRAMUSCULAR | Status: DC | PRN

## 2019-02-18 MED ORDER — ACETAMINOPHEN 10 MG/ML IV SOLN
1000.0000 mg | Freq: Four times a day (QID) | INTRAVENOUS | Status: AC
  Administered 2019-02-18 (×4): 1000 mg via INTRAVENOUS
  Filled 2019-02-18 (×4): qty 100

## 2019-02-18 MED ORDER — ONDANSETRON HCL 4 MG PO TABS: 4.0000 mg | ORAL_TABLET | Freq: Three times a day (TID) | ORAL | Status: DC | PRN

## 2019-02-18 MED ORDER — SODIUM CHLORIDE 0.9% IV SOLUTION: Freq: Once | INTRAVENOUS | Status: DC

## 2019-02-18 MED ORDER — ZOLPIDEM TARTRATE 5 MG PO TABS: 5.0000 mg | ORAL_TABLET | Freq: Every evening | ORAL | Status: DC | PRN

## 2019-02-18 NOTE — Progress Notes (Signed)
Urology Inpatient Progress Report  RIGHT STAGHORN STONE  Procedure(s): NEPHROLITHOTOMY PERCUTANEOUS WITH SURGEON ACCESS HOLMIUM LASER APPLICATION CYSTOSCOPY FLEXIBLE  1 Day Post-Op   Intv/Subj: Hgb dropping, patient otherwise asymptomatic. Complaining of pain with movement Mild nausea last night   Active Problems:   Nephrolithiasis  Current Facility-Administered Medications  Medication Dose Route Frequency Provider Last Rate Last Dose  . 0.9 %  sodium chloride infusion (Manually program via Guardrails IV Fluids)   Intravenous Once Franchot Gallo, MD      . acetaminophen (OFIRMEV) IV 1,000 mg  1,000 mg Intravenous Q6H Ardis Hughs, MD 400 mL/hr at 02/18/19 0555 1,000 mg at 02/18/19 0555  . Chlorhexidine Gluconate Cloth 2 % PADS 6 each  6 each Topical Daily Adhikari, Amrit, MD      . HYDROmorphone (DILAUDID) injection 0.5 mg  0.5 mg Intravenous Q2H PRN Ardis Hughs, MD      . lactated ringers infusion   Intravenous Continuous Ardis Hughs, MD   Stopped at 02/18/19 [phone]  . ondansetron (ZOFRAN) tablet 4 mg  4 mg Oral Q8H PRN Ardis Hughs, MD      . oxyCODONE (Oxy IR/ROXICODONE) immediate release tablet 5-10 mg  5-10 mg Oral Q4H PRN Ardis Hughs, MD   5 mg at 02/18/19 0118  . zolpidem (AMBIEN) tablet 5 mg  5 mg Oral QHS PRN Ardis Hughs, MD         Objective: Vital: Vitals:   02/18/19 0046 02/18/19 0155 02/18/19 0559 02/18/19 0800  BP: (!) 91/52  (!) 93/58 (!) 101/56  Pulse: 72  100 88  Resp: 20  14 16   Temp: 98.2 F (36.8 C)  98.3 F (36.8 C) 98.4 F (36.9 C)  TempSrc: Oral  Oral Oral  SpO2: 100%  100% 100%  Weight:  79.8 kg    Height:  5\' 4"  (1.626 m)     I/Os: I/O last 3 completed shifts: In: 4742.4 [P.O.:410; I.V.:3005.9; Other:600; IV Piggyback:726.5] Out: 2285 [Urine:1785; Blood:500]  Physical Exam:  General: Patient is in no apparent distress Lungs: Normal respiratory effort, chest expands symmetrically. GI:  Incisions are c/d/i. Neph tube draining blood tinged urine, foley draining straw colored urine. Ext: lower extremities symmetric  Lab Results: Recent Labs    02/16/19 1148  02/17/19 1821 02/18/19 0429 02/18/19 0802  WBC 3.5*  --  17.4* 13.5*  --   HGB 12.8   < > 8.4* 6.1* 5.7*  HCT 42.4   < > 27.6* 20.5* 18.8*   < > = values in this interval not displayed.   Recent Labs    02/16/19 1148 02/17/19 1653 02/17/19 1821 02/18/19 0429  NA 139 141 141 136  K 3.7 4.4 4.1 4.6  CL 109  --  111 107  CO2 24  --  23 21*  GLUCOSE 87  --  152* 132*  BUN 17  --  14 16  CREATININE 1.18*  --  1.36* 1.36*  CALCIUM 9.8  --  7.9* 7.7*   No results for input(s): LABPT, INR in the last 72 hours. No results for input(s): LABURIN in the last 72 hours. Results for orders placed or performed during the hospital encounter of 02/16/19  Urine culture     Status: None   Collection Time: 02/16/19 11:48 AM   Specimen: Urine, Clean Catch  Result Value Ref Range Status   Specimen Description   Final    URINE, CLEAN CATCH Performed at [facility], [place]  [address], [place], [place] [address]    Special Requests   Final    NONE Performed at [facility], [address] [place], [place] [address]    Culture   Final    NO GROWTH Performed at [facility], [address], [place], [place] [address]    Report Status 02/17/2019 FINAL  Final    Studies/Results: Dg Abd 1 View  Result Date: 02/18/2019 CLINICAL DATA:  Nephrolithiasis. Right staghorn calculus. EXAM: ABDOMEN - 1 VIEW COMPARISON:  CT of the abdomen pelvis at [facility]. FINDINGS: Bowel gas pattern is normal. Large more nephrostomy tube present. A double-J ureteral stent is in place. Staghorn calculi are no longer evident. Smaller stone fragments remain, more prominent at the upper pole. Axial skeleton is within normal limits. IMPRESSION: 1. Right double-J ureteral stent in place. 2.  Staghorn calculi are no longer evident. 3. Smaller stone fragments are present, predominantly at the upper pole following nephrolithotomy. Electronically Signed   By: Marin Roberts M.D.   On: 02/18/2019 05:47   Dg C-arm 1-60 Min-no Report  Result Date: 02/17/2019 Fluoroscopy was utilized by the requesting physician.  No radiographic interpretation.    Assessment: Procedure(s): NEPHROLITHOTOMY PERCUTANEOUS WITH SURGEON ACCESS HOLMIUM LASER APPLICATION CYSTOSCOPY FLEXIBLE, 1 Day Post-Op  Now with anemia from acute surgical blood loss.  Doesn't appear to have any active bleeding from tubes.  Fortunately she is HD stable.  Plan: Transfuse two units NPO until hgb stablizes Continue IVF Bedrest  IV tylenol for pain  Berniece Salines, MD Urology 02/18/2019, 9:26 AM

## 2019-02-18 NOTE — Progress Notes (Signed)
CRITICAL VALUE ALERT  Critical Value:  Hgb 6.1  Date & Time Notied:  02/18/19 05:30am  Provider Notified: Dr. Diona Fanti  Orders Received/Actions taken: Type & screen & give 2 units PRBCs

## 2019-02-18 NOTE — Progress Notes (Signed)
PIV consult: Pt stated MD mentioned LR could be stopped. She would rather not be stuck again if possible. L hand IV patent. Discussed with RN: She will re-enter consult if pt requires simultaneous incompatible meds.

## 2019-02-18 NOTE — Progress Notes (Signed)
Patient refused to allow NT or RN to perform peri and foley care. Patient educated on importance of maintaining a clean environment around the catheter and risk of infection if not cleaned. Patient verbalized understanding but still refusing for staff to perform. Patient stated she would perform it herself. Patient educated on how to properly clean peri area and foley. Foley wipes supplied for patient.

## 2019-02-18 NOTE — Progress Notes (Signed)
Patient had requested not to receive albumin, due to normally running low blood pressures.  Her BP had been consistent except for a brief episode of nausea. Upon repeat episode of nausea she received phenergan 6.25 mg IV very slowly with a subsequent decrease in BP. She agreed after discussing to receive the albumin.

## 2019-02-19 LAB — BPAM RBC
Blood Product Expiration Date: 202012222359
Blood Product Expiration Date: 202012222359
ISSUE DATE / TIME: 202012031229
ISSUE DATE / TIME: 202012031610
Unit Type and Rh: 6200
Unit Type and Rh: 6200

## 2019-02-19 LAB — TYPE AND SCREEN
ABO/RH(D): A POS
Antibody Screen: NEGATIVE
Unit division: 0
Unit division: 0

## 2019-02-19 LAB — CBC
HCT: 25.1 % — ABNORMAL LOW (ref 36.0–46.0)
Hemoglobin: 8 g/dL — ABNORMAL LOW (ref 12.0–15.0)
MCH: 26.9 pg (ref 26.0–34.0)
MCHC: 31.9 g/dL (ref 30.0–36.0)
MCV: 84.5 fL (ref 80.0–100.0)
Platelets: 87 10*3/uL — ABNORMAL LOW (ref 150–400)
RBC: 2.97 MIL/uL — ABNORMAL LOW (ref 3.87–5.11)
RDW: 15.4 % (ref 11.5–15.5)
WBC: 10.7 10*3/uL — ABNORMAL HIGH (ref 4.0–10.5)
nRBC: 0 % (ref 0.0–0.2)

## 2019-02-19 LAB — BASIC METABOLIC PANEL
Anion gap: 9 (ref 5–15)
BUN: 14 mg/dL (ref 6–20)
CO2: 22 mmol/L (ref 22–32)
Calcium: 8.3 mg/dL — ABNORMAL LOW (ref 8.9–10.3)
Chloride: 106 mmol/L (ref 98–111)
Creatinine, Ser: 1.19 mg/dL — ABNORMAL HIGH (ref 0.44–1.00)
GFR calc Af Amer: >60 mL/min (ref >60)
GFR calc non Af Amer: 58 mL/min — ABNORMAL LOW (ref >60)
Glucose, Bld: 91 mg/dL (ref 70–99)
Potassium: 3.9 mmol/L (ref 3.5–5.1)
Sodium: 137 mmol/L (ref 135–145)

## 2019-02-19 MED ORDER — TRAMADOL HCL 50 MG PO TABS: 50.0000 mg | ORAL_TABLET | Freq: Four times a day (QID) | ORAL | 0 refills | Status: DC | PRN

## 2019-02-19 NOTE — Discharge Summary (Addendum)
Date of admission: 02/17/2019  Date of discharge: 02/19/2019  Admission diagnosis: cystine staghorn stone in right kidney  Discharge diagnosis: same, anemia from acute surgical blood loss  Secondary diagnoses:  Patient Active Problem List   Diagnosis Date Noted  . Nephrolithiasis 02/17/2019  . History of calculus of kidney during pregnancy 12/24/2017  . Low mean corpuscular volume (MCV) 12/24/2017  . Leukopenia 12/24/2017  . Lymphadenopathy of left cervical region 12/24/2017  . PULMONARY SARCOIDOSIS 09/06/2009    Procedures performed: Procedure(s): NEPHROLITHOTOMY PERCUTANEOUS WITH SURGEON ACCESS HOLMIUM LASER APPLICATION CYSTOSCOPY FLEXIBLE  History and Physical: For full details, please see admission history and physical. Briefly, Kara Atkinson is a 38 y.o. year old patient with history of cystine stones and a right staghorn calculi.   Hospital Course: Patient tolerated the procedure well.  She was then transferred to the floor after an uneventful PACU stay. Her hemoglobin drifted down to 5.7 from a normal pre-op level but she remained hemodynamically stable.  Her tubes were draining straw colored urine for her whole recovery suggesting the bleeding occurred in the OR.  She was transfused 2 units of blood and her hemoglobin remained stable.  Her neph tube and foley were removed prior to discharge.  On POD#2 she had met discharge criteria: was eating a regular diet, was up and ambulating independently,  pain was well controlled, was voiding without a catheter, and was ready to for discharge.  PE on day of discharge: NAD Vitals:   02/18/19 1555 02/18/19 1641 02/18/19 2012 02/19/19 0535  BP: (!) 94/53 103/61 98/65 105/61  Pulse: 92 89 (!) 110 100  Resp: 16 18  (!) 24  Temp: 98.7 F (37.1 C) 98.6 F (37 C) 100.1 F (37.8 C) 98.8 F (37.1 C)  TempSrc: Oral Oral Oral Oral  SpO2: 100% 100% 95% 100%  Weight:      Height:        Non labored breathing Right neph tube site was  clean, dressings were not saturated Abdomen was soft   Laboratory values:  Recent Labs    02/17/19 1821 02/18/19 0429 02/18/19 0802 02/18/19 2221 02/19/19 0545  WBC 17.4* 13.5*  --   --  10.7*  HGB 8.4* 6.1* 5.7* 8.0* 8.0*  HCT 27.6* 20.5* 18.8* 24.8* 25.1*   Recent Labs    02/17/19 1821 02/18/19 0429 02/19/19 0545  NA 141 136 137  K 4.1 4.6 3.9  CL 111 107 106  CO2 23 21* 22  GLUCOSE 152* 132* 91  BUN '14 16 14  ' CREATININE 1.36* 1.36* 1.19*  CALCIUM 7.9* 7.7* 8.3*   No results for input(s): LABPT, INR in the last 72 hours. No results for input(s): LABURIN in the last 72 hours. Results for orders placed or performed during the hospital encounter of 02/16/19  Urine culture     Status: None   Collection Time: 02/16/19 11:48 AM   Specimen: Urine, Clean Catch  Result Value Ref Range Status   Specimen Description   Final    URINE, CLEAN CATCH Performed at [facility], [place] [address] [place], [place] [address]    Special Requests   Final    NONE Performed at [facility], [place] [address], [place], [place] [address]    Culture   Final    NO GROWTH Performed at [facility], [place] [address], [place],  [address]    Report Status 02/17/2019 FINAL  Final    Disposition: Home  Discharge instruction: The  patient was instructed to be ambulatory but told to refrain from heavy lifting, strenuous activity, or driving.   Discharge medications:  Allergies as of 02/19/2019      Reactions   Sulfonamide Derivatives Other (See Comments)   Patient denies allergy      Medication List    TAKE these medications   multivitamin with minerals Tabs tablet Take 1 tablet by mouth daily.   traMADol 50 MG tablet Commonly known as: Ultram Take 1-2 tablets (50-100 mg total) by mouth every 6 (six) hours as needed for moderate pain.   TURMERIC PO Take 1 capsule by mouth daily.   VITAMIN D3 PO Take 1 tablet by mouth daily.    VITAMIN E PO Take 1 capsule by mouth daily.       Followup:  Follow-up Information    Ardis Hughs, MD On 03/03/2019.   Specialty: Urology Why: [place]ard information: [place]  [id number] [phone]

## 2019-02-19 NOTE — Discharge Instructions (Signed)
Discharge instructions following PCNL  Call your doctor for: Fevers greater than 100.5 Severe nausea or vomiting Increasing pain not controlled by pain medication Increasing redness or drainage from incisions Decreased urine output or a catheter is no longer draining  The number for questions is [phone].  Activity: Gradually increase activity with short frequent walks, 3-4 times a day.  Avoid strenuous activities, like sports, lawn-mowing, or heavy lifting (more than 10-15 pounds).  Wear loose, comfortable clothing that pull or kink the tube or tubes.  Do not drive while taking pain medication, or until your doctor permitts it.  Bathing and dressing changes: You should not shower for 48 hours after surgery.  Do not soak your back in a bathtub.  Diet: It is extremely important to drink plenty of fluids after surgery, especially water.  You may resume your regular diet, unless otherwise instructed.  Medications: May take Tylenol (acetaminophen) or ibuprofen (Advil, Motrin) as directed over-the-counter. Take any prescriptions as directed.

## 2019-04-28 ENCOUNTER — Ambulatory Visit: Payer: No Typology Code available for payment source | Admitting: Critical Care Medicine

## 2019-05-11 ENCOUNTER — Encounter: Payer: Self-pay | Admitting: Critical Care Medicine

## 2019-05-11 ENCOUNTER — Ambulatory Visit
Payer: No Typology Code available for payment source | Attending: Critical Care Medicine | Admitting: Critical Care Medicine

## 2019-05-11 ENCOUNTER — Other Ambulatory Visit: Payer: Self-pay

## 2019-05-11 VITALS — BP 117/70 | HR 68 | Temp 97.8°F | Resp 18 | Ht 64.0 in | Wt 173.0 lb

## 2019-05-11 DIAGNOSIS — R718 Other abnormality of red blood cells: Secondary | ICD-10-CM

## 2019-05-11 DIAGNOSIS — D649 Anemia, unspecified: Secondary | ICD-10-CM

## 2019-05-11 DIAGNOSIS — N200 Calculus of kidney: Secondary | ICD-10-CM

## 2019-05-11 DIAGNOSIS — D869 Sarcoidosis, unspecified: Secondary | ICD-10-CM | POA: Diagnosis not present

## 2019-05-11 DIAGNOSIS — R590 Localized enlarged lymph nodes: Secondary | ICD-10-CM

## 2019-05-11 DIAGNOSIS — Z114 Encounter for screening for human immunodeficiency virus [HIV]: Secondary | ICD-10-CM

## 2019-05-11 DIAGNOSIS — Z1321 Encounter for screening for nutritional disorder: Secondary | ICD-10-CM

## 2019-05-11 NOTE — Assessment & Plan Note (Signed)
History of pulmonary sarcoidosis we will follow-up ACE level note previous pulmonary functions in 2016 were completely normal

## 2019-05-11 NOTE — Assessment & Plan Note (Signed)
Staghorn calculus status post resection per urology  Follow-up images showed no residual staghorn calculus seen  Follow-up per urology

## 2019-05-11 NOTE — Assessment & Plan Note (Signed)
Etiology of low mean corpuscular volume unclear  Follow-up CBC

## 2019-05-11 NOTE — Patient Instructions (Signed)
Make sure you obtain a Covid vaccine is your healthcare worker  Labs today include metabolic panel complete blood count HIV calcium level and angiotensin-converting enzyme to assess for pulmonary sarcoid status, also plan to check a vitamin D level today   A good dose of vitamin D for you to take would include a dose of 2000 units daily  Return to see Dr. Delford Field 4 months

## 2019-05-11 NOTE — Assessment & Plan Note (Signed)
Anemia likely from chronic blood loss from staghorn calculus of the right kidney status post resection  We will follow-up with complete blood count and iron levels at this visit

## 2019-05-11 NOTE — Progress Notes (Signed)
Subjective:    Patient ID: Kara Atkinson, female    DOB: January 13, 1981, 39 y.o.   MRN: [id number]  39 y.o.F this patient is here for evaluation of lymphadenopathy and previous history of sarcoidosis.  She is noted cervical lymphadenopathy for the past 3 months and associated increased dizziness.  While the lightheadedness is improved she subsequently sought care at the emergency room on 12/09/2017. The patient was found to have very small cervical adenopathy left greater than right neck and no other lymphadenopathy was seen.  The complete blood count did show mild leukopenia with lymphocytosis.  Platelet count also was down 150,000 from previous values of 280,000.  The patient denies any bruising weight loss cough dyspnea fever chills or sweats.  There are no other significant constitutional symptoms.  The patient is here now for evaluation of the lymphadenopathy.  The patient was diagnosed with sarcoidosis in 2011 was followed over period of time for this she has bilateral upper lobe scarring.  There is also mediastinal hilar adenopathy seen.  She had an elevated ACE level.  Her pulmonary function showed mild changes.  She was released from pulmonary care in 2016.  She categorically does states she has no dyspnea cough shortness of breath or other pulmonary complaints.     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 Nephrolithiasis 02/17/2019 . History of calculus of kidney during pregnancy 12/24/2017 . Low mean corpuscular volume (MCV) 12/24/2017 . Leukopenia 12/24/2017 . Lymphadenopathy of left cervical region 12/24/2017 . PULMONARY  SARCOIDOSIS 09/06/2009   Procedures performed: Procedure(s): NEPHROLITHOTOMY PERCUTANEOUS WITH SURGEON ACCESS HOLMIUM LASER APPLICATION CYSTOSCOPY FLEXIBLE  History and Physical: For full details, please see admission history and physical. Briefly, Kara Atkinson is a 39 y.o. year old patient with history of cystine stones and a right staghorn calculi.   Hospital Course: Patient tolerated the procedure well.  She was then transferred to the floor after an uneventful PACU stay. Her hemoglobin drifted down to 5.7 from a normal pre-op level but she remained hemodynamically stable.  Her tubes were draining straw colored urine for her whole recovery suggesting the bleeding occurred in the OR.  She was transfused 2 units of blood and her hemoglobin remained stable.  Her neph tube and foley were removed prior to discharge.  On POD#2 she had met discharge criteria: was eating a regular diet, was up and ambulating independently,  pain was well controlled, was voiding without a catheter, and was ready to for discharge.    05/11/2019 This is a follow-up visit for this 39 year old female prior history of pulmonary sarcoidosis and chronic left cervical lymphadenopathy.  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  She underwent nephrostomy and cystoscopy resection of the stone.  Hemoglobin dropped from 13 down to 5.7 and she did receive 2 units of blood.  At discharge her hemoglobin was up to 8.0.  Patient states her breathing is very stable with prior history of pulmonary sarcoidosis.  Chest x-ray in October 2019 showed stable disease.  The patient is asking if she can have an assessment for disease activity  of the sarcoidosis.  Patient also wishes to establish primary care with me at this clinic and her main primary care gap is that she needs an HIV study  She did follow-up with  oncology and they did not determine any malignant or primary hematologic cause of her low MCV volume RBCs   Past Medical History:  Diagnosis Date  . Glucose-6-phosphate dehydrogenase deficiency   . Kidney stone   . Pulmonary sarcoidosis ([place])      Family History  Problem Relation Age of Onset  . Sarcoidosis Other        cousin     Social History   Socioeconomic History  . Marital status: Married    Spouse name: Not on file  . Number of children: Not on file  . Years of education: Not on file  . Highest education level: Not on file  Occupational History  . Occupation: CNA  Tobacco Use  . Smoking status: Former Smoker    Packs/day: 0.50    Years: 10.00    Pack years: 5.00    Types: Cigarettes    Quit date: 10/16/2008    Years since quitting: 10.5  . Smokeless tobacco: Never Used  Substance and Sexual Activity  . Alcohol use: No  . Drug use: No  . Sexual activity: Yes  Other Topics Concern  . Not on file  Social History Narrative  . Not on file   Social Determinants of Health   Financial Resource Strain:   . Difficulty of Paying Living Expenses: Not on file  Food Insecurity:   . Worried About Charity fundraiser in the Last Year: Not on file  . Ran Out of Food in the Last Year: Not on file  Transportation Needs:   . Lack of Transportation (Medical): Not on file  . Lack of Transportation (Non-Medical): Not on file  Physical Activity:   . Days of Exercise per Week: Not on file  . Minutes of Exercise per Session: Not on file  Stress:   . Feeling of Stress : Not on file  Social Connections:   . Frequency of Communication with Friends and Family: Not on file  . Frequency of Social Gatherings with Friends and Family: Not on file  . Attends Religious Services: Not on file  . Active Member of Clubs or Organizations: Not on file  . Attends Archivist Meetings: Not on file  . Marital Status: Not on file  Intimate Partner Violence:   . Fear of Current or  Ex-Partner: Not on file  . Emotionally Abused: Not on file  . Physically Abused: Not on file  . Sexually Abused: Not on file     Allergies  Allergen Reactions  . Sulfonamide Derivatives Other (See Comments)    Patient denies allergy     Outpatient Medications Prior to Visit  Medication Sig Dispense Refill  . Cholecalciferol (VITAMIN D3 PO) Take 1 tablet by mouth daily.    . Multiple Vitamin (MULTIVITAMIN WITH MINERALS) TABS tablet Take 1 tablet by mouth daily.    . TURMERIC PO Take 1 capsule by mouth daily.    Marland Kitchen VITAMIN E PO Take 1 capsule by mouth daily.    . traMADol (ULTRAM) 50 MG tablet Take 1-2 tablets (50-100 mg total) by mouth every 6 (six) hours as needed for moderate pain. (Patient not taking: Reported on 05/11/2019) 15 tablet 0   No facility-administered medications prior to visit.     Review of Systems  Constitutional: Negative for  activity change, appetite change, chills, diaphoresis, fatigue and fever.  HENT: Negative for congestion, ear discharge, ear pain, facial swelling, hearing loss, mouth sores, nosebleeds, postnasal drip, rhinorrhea, sinus pressure, sinus pain, sneezing, sore throat, trouble swallowing and voice change.   Eyes: Negative.   Respiratory: Negative for cough, choking, chest tightness, shortness of breath and wheezing.   Cardiovascular: Negative for chest pain, palpitations and leg swelling.  Gastrointestinal: Negative for abdominal pain, diarrhea, nausea and vomiting.  Endocrine: Negative.   Genitourinary: Negative.   Musculoskeletal: Negative.   Skin: Negative.  Negative for rash.  Neurological: Negative for dizziness, tremors, seizures, syncope, speech difficulty, weakness, numbness and headaches.  Hematological: Positive for adenopathy. Does not bruise/bleed easily.  Psychiatric/Behavioral: Negative.        Objective:   Physical Exam  Vitals:   05/11/19 1002  Weight: 173 lb (78.5 kg)  Height: _0  (1.626 m)    Gen: Pleasant,  well-nourished, in no distress,  normal affect  ENT: No lesions,  mouth clear,  oropharynx clear, no postnasal drip  Neck: No JVD, no TMG, no carotid bruits  Lymphatics: There is a very small tiny pea-sized lymph nodes that are movable in the left anterior cervical chain the submandibular and some axillary nodes are not enlarged the axillary lymph nodes and femoral lymph nodes are not enlarged no other lymph nodes are palpated these actually have decreased in size from prior exams  Lungs: No use of accessory muscles, no dullness to percussion, clear without rales or rhonchi  Cardiovascular: RRR, heart sounds normal, no murmur or gallops, no peripheral edema  Abdomen: soft and NT, no HSM,  BS normal  Musculoskeletal: No deformities, no cyanosis or clubbing  Neuro: alert, non focal  Skin: Warm, no lesions or rashes, no sign of bruising or petechiae  No results found. Lab Results  Component Value Date   WBC 10.7 (H) 02/19/2019   HGB 8.0 (L) 02/19/2019   HCT 25.1 (L) 02/19/2019   MCV 84.5 02/19/2019   PLT 87 (L) 02/19/2019   CXR 2016:   IMPRESSION: Residual bilateral coarse upper lobe predominant interstitial opacities are somewhat improved from recent prior exam. Findings are compatible with sarcoidosis.  CXR 10/19:  No change, no acute process   PFTs 2016: Completely normal ACE level June 2011 100   BMP Latest Ref Rng & Units 02/19/2019 02/18/2019 02/17/2019  Glucose 70 - 99 mg/dL 91 132(H) 152(H)  BUN 6 - 20 mg/dL _1 Creatinine 0.44 - 1.00 mg/dL 1.19(H) 1.36(H) 1.36(H)  Sodium 135 - 145 mmol/L 137 136 141  Potassium 3.5 - 5.1 mmol/L 3.9 4.6 4.1  Chloride 98 - 111 mmol/L 106 107 111  CO2 22 - 32 mmol/L 22 21(L) 23  Calcium 8.9 - 10.3 mg/dL 8.3(L) 7.7(L) 7.9(L)   Pathologist review of peripheral smear shows erythrocytosis with polycythemia features with numerous small erythrocytes that are nucleated     Assessment & Plan:  I personally reviewed all images and  lab data in the [facility] system as well as any outside material available during this office visit and agree with the  radiology impressions.   Anemia Anemia likely from chronic blood loss from staghorn calculus of the right kidney status post resection  We will follow-up with complete blood count and iron levels at this visit  Nephrolithiasis Staghorn calculus status post resection per urology  Follow-up images showed no residual staghorn calculus seen  Follow-up per urology  Low mean corpuscular volume (MCV) Etiology of low  mean corpuscular volume unclear  Follow-up CBC  PULMONARY SARCOIDOSIS History of pulmonary sarcoidosis we will follow-up ACE level note previous pulmonary functions in 2016 were completely normal   Lisa was seen today for follow-up.  Diagnoses and all orders for this visit:  PULMONARY SARCOIDOSIS -     Comprehensive metabolic panel -     Angiotensin converting enzyme -     VITAMIN D 25 Hydroxy (Vit-D Deficiency, Fractures)  Nephrolithiasis  Lymphadenopathy of left cervical region -     Comprehensive metabolic panel  Low mean corpuscular volume (MCV) -     CBC with Differential/Platelet; Future  Encounter for screening for HIV -     HIV Antibody (routine testing w rflx)  Encounter for vitamin deficiency screening -     VITAMIN D 25 Hydroxy (Vit-D Deficiency, Fractures)  Anemia, unspecified type     A screening HIV study will also be obtained and a vitamin D level as well

## 2019-05-12 LAB — COMPREHENSIVE METABOLIC PANEL
ALT: 16 IU/L (ref 0–32)
AST: 41 IU/L — ABNORMAL HIGH (ref 0–40)
Albumin/Globulin Ratio: 1.3 (ref 1.2–2.2)
Albumin: 4.7 g/dL (ref 3.8–4.8)
Alkaline Phosphatase: 56 IU/L (ref 39–117)
BUN/Creatinine Ratio: 11 (ref 9–23)
BUN: 15 mg/dL (ref 6–20)
Bilirubin Total: 1.3 mg/dL — ABNORMAL HIGH (ref 0.0–1.2)
CO2: 24 mmol/L (ref 20–29)
Calcium: 10.9 mg/dL — ABNORMAL HIGH (ref 8.7–10.2)
Chloride: 103 mmol/L (ref 96–106)
Creatinine, Ser: 1.33 mg/dL — ABNORMAL HIGH (ref 0.57–1.00)
GFR calc Af Amer: 59 mL/min/{1.73_m2} — ABNORMAL LOW (ref >59)
GFR calc non Af Amer: 51 mL/min/{1.73_m2} — ABNORMAL LOW (ref >59)
Globulin, Total: 3.5 g/dL (ref 1.5–4.5)
Glucose: 76 mg/dL (ref 65–99)
Potassium: 4.8 mmol/L (ref 3.5–5.2)
Sodium: 139 mmol/L (ref 134–144)
Total Protein: 8.2 g/dL (ref 6.0–8.5)

## 2019-05-12 LAB — VITAMIN D 25 HYDROXY (VIT D DEFICIENCY, FRACTURES): Vit D, 25-Hydroxy: 36 ng/mL (ref 30.0–100.0)

## 2019-05-12 LAB — HIV ANTIBODY (ROUTINE TESTING W REFLEX): HIV Screen 4th Generation wRfx: NONREACTIVE

## 2019-05-12 LAB — ANGIOTENSIN CONVERTING ENZYME: Angio Convert Enzyme: 32 U/L (ref 14–82)

## 2019-07-08 ENCOUNTER — Ambulatory Visit: Payer: No Typology Code available for payment source | Attending: Critical Care Medicine

## 2019-07-08 ENCOUNTER — Ambulatory Visit: Payer: No Typology Code available for payment source | Admitting: Critical Care Medicine

## 2019-07-08 ENCOUNTER — Other Ambulatory Visit: Payer: Self-pay

## 2019-07-08 DIAGNOSIS — R718 Other abnormality of red blood cells: Secondary | ICD-10-CM

## 2019-07-09 LAB — CBC WITH DIFFERENTIAL/PLATELET
Basophils Absolute: 0 10*3/uL (ref 0.0–0.2)
Basos: 1 %
EOS (ABSOLUTE): 0 10*3/uL (ref 0.0–0.4)
Eos: 0 %
Hematocrit: 39.8 % (ref 34.0–46.6)
Hemoglobin: 12.4 g/dL (ref 11.1–15.9)
Immature Grans (Abs): 0 10*3/uL (ref 0.0–0.1)
Immature Granulocytes: 0 %
Lymphocytes Absolute: 0.9 10*3/uL (ref 0.7–3.1)
Lymphs: 27 %
MCH: 24 pg — ABNORMAL LOW (ref 26.6–33.0)
MCHC: 31.2 g/dL — ABNORMAL LOW (ref 31.5–35.7)
MCV: 77 fL — ABNORMAL LOW (ref 79–97)
Monocytes Absolute: 0.4 10*3/uL (ref 0.1–0.9)
Monocytes: 10 %
Neutrophils Absolute: 2.1 10*3/uL (ref 1.4–7.0)
Neutrophils: 62 %
Platelets: 182 10*3/uL (ref 150–450)
RBC: 5.17 x10E6/uL (ref 3.77–5.28)
RDW: 14 % (ref 11.7–15.4)
WBC: 3.4 10*3/uL (ref 3.4–10.8)

## 2020-01-18 ENCOUNTER — Ambulatory Visit: Payer: No Typology Code available for payment source | Admitting: Critical Care Medicine

## 2020-02-24 ENCOUNTER — Other Ambulatory Visit: Payer: Self-pay

## 2020-02-24 ENCOUNTER — Encounter: Payer: Self-pay | Admitting: Critical Care Medicine

## 2020-02-24 ENCOUNTER — Ambulatory Visit
Payer: No Typology Code available for payment source | Attending: Critical Care Medicine | Admitting: Critical Care Medicine

## 2020-02-24 DIAGNOSIS — D72819 Decreased white blood cell count, unspecified: Secondary | ICD-10-CM

## 2020-02-24 DIAGNOSIS — Z8759 Personal history of other complications of pregnancy, childbirth and the puerperium: Secondary | ICD-10-CM

## 2020-02-24 DIAGNOSIS — D649 Anemia, unspecified: Secondary | ICD-10-CM

## 2020-02-24 DIAGNOSIS — R718 Other abnormality of red blood cells: Secondary | ICD-10-CM | POA: Diagnosis not present

## 2020-02-24 DIAGNOSIS — Z87442 Personal history of urinary calculi: Secondary | ICD-10-CM

## 2020-02-24 DIAGNOSIS — N200 Calculus of kidney: Secondary | ICD-10-CM

## 2020-02-24 DIAGNOSIS — D869 Sarcoidosis, unspecified: Secondary | ICD-10-CM

## 2020-02-24 NOTE — Progress Notes (Signed)
No issues f/u on labs

## 2020-02-24 NOTE — Assessment & Plan Note (Signed)
Currently stable without hemorrhage

## 2020-02-24 NOTE — Assessment & Plan Note (Signed)
Repeat CBC

## 2020-02-24 NOTE — Progress Notes (Addendum)
Subjective:    Patient ID: Kara Atkinson, female    DOB: [birth year], 39 y.o.   MRN: [id number] Virtual Visit via Telephone Note  I connected with Kara Atkinson on 02/24/20 at  9:30 AM EST by telephone and verified that I am speaking with the correct person using two identifiers.   Consent:  I discussed the limitations, risks, security and privacy concerns of performing an evaluation and management service by telephone and the availability of in person appointments. I also discussed with the patient that there may be a patient responsible charge related to this service. The patient expressed understanding and agreed to proceed.  Location of patient:Pt at home  Location of provider:i am in my office  Persons participating in the televisit with the patient.    noone else on the call   History of Present Illness:    Observations/Objective:   Assessment and Plan:    39 y.o.F this patient is here for evaluation of lymphadenopathy and previous history of sarcoidosis.  She is noted cervical lymphadenopathy for the past 3 months and associated increased dizziness.  While the lightheadedness is improved she subsequently sought care at the emergency room on 12/09/2017. The patient was found to have very small cervical adenopathy left greater than right neck and no other lymphadenopathy was seen.  The complete blood count did show mild leukopenia with lymphocytosis.  Platelet count also was down 150,000 from previous values of 280,000.  The patient denies any bruising weight loss cough dyspnea fever chills or sweats.  There are no other significant constitutional symptoms.  The patient is here now for evaluation of the lymphadenopathy.  The patient was diagnosed with sarcoidosis in 2011 was followed over period of time for this she has bilateral upper lobe scarring.  There is also mediastinal hilar adenopathy seen.  She had an elevated ACE level.  Her pulmonary function showed mild changes.   She was released from pulmonary care in 2016.  She categorically does states she has no dyspnea cough shortness of breath or other pulmonary complaints.     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 Nephrolithiasis 02/17/2019 . History of calculus of kidney during pregnancy 12/24/2017 . Low mean corpuscular volume (MCV) 12/24/2017 . Leukopenia 12/24/2017 . Lymphadenopathy of left cervical region 12/24/2017 . PULMONARY SARCOIDOSIS 09/06/2009   Procedures performed: Procedure(s): NEPHROLITHOTOMY PERCUTANEOUS WITH SURGEON ACCESS HOLMIUM LASER APPLICATION CYSTOSCOPY FLEXIBLE  History and Physical: For full details, please see admission history and physical. Briefly, Kara Atkinson is a 39 y.o. year old patient with history of cystine stones and a right staghorn calculi.   Hospital Course: Patient tolerated the procedure well.  She was then transferred to the floor after an uneventful PACU stay. Her hemoglobin drifted down to 5.7 from a normal pre-op level but she remained hemodynamically stable.  Her tubes were draining straw colored urine for her whole recovery suggesting the bleeding occurred in the OR.  She was transfused 2 units of blood and her hemoglobin remained stable.  Her neph tube and foley were removed prior to discharge.  On POD#2 she had met discharge criteria: was eating a regular diet, was up and ambulating independently,  pain was  well controlled, was voiding without a catheter, and was ready to for discharge.    05/11/2019 This is a follow-up visit for this 39 year old female prior history of pulmonary sarcoidosis and chronic left cervical  lymphadenopathy.  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  She underwent nephrostomy and cystoscopy resection of the stone.  Hemoglobin dropped from 13 down to 5.7 and she did receive 2 units of blood.  At discharge her hemoglobin was up to 8.0.  Patient states her breathing is very stable with prior history of pulmonary sarcoidosis.  Chest x-ray in October 2019 showed stable disease.  The patient is asking if she can have an assessment for disease activity of the sarcoidosis.  Patient also wishes to establish primary care with me at this clinic and her main primary care gap is that she needs an HIV study  She did follow-up with oncology and they did not determine any malignant or primary hematologic cause of her low MCV volume RBCs  02/24/2020 This patient is seen in return follow-up by way of a telephone visit The patient has no specific complaints.  She has no weakness or other complaints.  She follow-up with nephrology her calcium levels are now normal.  She is concerned about the low and see the levels of her red blood cells and is requesting additional lab work on this  The patient did have Covid a month ago and received a monoclonal antibody infusion she is now committed to getting the Covid vaccine at this time when she is due up in 2 more months No pulmonary complications or symptoms at this time   Past Medical History:  Diagnosis Date  . Glucose-6-phosphate dehydrogenase deficiency   . Kidney stone   . Pulmonary sarcoidosis ([place])      Family History  Problem Relation Age of Onset  . Sarcoidosis Other        cousin     Social History   Socioeconomic History  . Marital status: Married    Spouse name: Not on file  . Number of children: Not on file  . Years of education: Not on file  . Highest education level: Not on file  Occupational History  .  Occupation: CNA  Tobacco Use  . Smoking status: Former Smoker    Packs/day: 0.50    Years: 10.00    Pack years: 5.00    Types: Cigarettes    Quit date: 10/16/2008    Years since quitting: 11.3  . Smokeless tobacco: Never Used  Vaping Use  . Vaping Use: Never used  Substance and Sexual Activity  . Alcohol use: No  . Drug use: No  . Sexual activity: Yes  Other Topics Concern  . Not on file  Social History Narrative  . Not on file   Social Determinants of Health   Financial Resource Strain: Not on file  Food Insecurity: Not on file  Transportation Needs: Not on file  Physical Activity: Not on file  Stress: Not on file  Social Connections: Not on file  Intimate Partner Violence: Not on file     Allergies  Allergen Reactions  . Sulfonamide Derivatives Other (See Comments)    Patient denies allergy     Outpatient Medications Prior to Visit  Medication Sig Dispense Refill  . Cholecalciferol (VITAMIN D3 PO) Take 1 tablet by mouth daily. (Patient not taking: Reported on 02/24/2020)    .  Multiple Vitamin (MULTIVITAMIN WITH MINERALS) TABS tablet Take 1 tablet by mouth daily. (Patient not taking: Reported on 02/24/2020)    . TURMERIC PO Take 1 capsule by mouth daily. (Patient not taking: Reported on 02/24/2020)    . VITAMIN E PO Take 1 capsule by mouth daily. (Patient not taking: Reported on 02/24/2020)     No facility-administered medications prior to visit.     Review of Systems  Constitutional: Negative for activity change, appetite change, chills, diaphoresis, fatigue and fever.  HENT: Negative for congestion, ear discharge, ear pain, facial swelling, hearing loss, mouth sores, nosebleeds, postnasal drip, rhinorrhea, sinus pressure, sinus pain, sneezing, sore throat, trouble swallowing and voice change.   Eyes: Negative.   Respiratory: Negative for cough, choking, chest tightness, shortness of breath and wheezing.   Cardiovascular: Negative for chest pain, palpitations and leg  swelling.  Gastrointestinal: Negative for abdominal pain, diarrhea, nausea and vomiting.  Endocrine: Negative.   Genitourinary: Negative.   Musculoskeletal: Negative.   Skin: Negative.  Negative for rash.  Neurological: Negative for dizziness, tremors, seizures, syncope, speech difficulty, weakness, numbness and headaches.  Hematological: Positive for adenopathy. Does not bruise/bleed easily.  Psychiatric/Behavioral: Negative.        Objective:   Physical Exam   There were no vitals filed for this visit. No results found. Lab Results  Component Value Date   WBC 3.4 07/08/2019   HGB 12.4 07/08/2019   HCT 39.8 07/08/2019   MCV 77 (L) 07/08/2019   PLT 182 07/08/2019   CXR 2016:   IMPRESSION: Residual bilateral coarse upper lobe predominant interstitial opacities are somewhat improved from recent prior exam. Findings are compatible with sarcoidosis.  CXR 10/19:  No change, no acute process   PFTs 2016: Completely normal ACE level June 2011 100   BMP Latest Ref Rng & Units 05/11/2019 02/19/2019 02/18/2019  Glucose 65 - 99 mg/dL 76 91 132(H)  BUN 6 - 20 mg/dL '15 14 16  ' Creatinine 0.57 - 1.00 mg/dL 1.33(H) 1.19(H) 1.36(H)  BUN/Creat Ratio 9 - 23 11 - -  Sodium 134 - 144 mmol/L 139 137 136  Potassium 3.5 - 5.2 mmol/L 4.8 3.9 4.6  Chloride 96 - 106 mmol/L 103 106 107  CO2 20 - 29 mmol/L 24 22 21(L)  Calcium 8.7 - 10.2 mg/dL 10.9(H) 8.3(L) 7.7(L)   Pathologist review of peripheral smear shows erythrocytosis with polycythemia features with numerous small erythrocytes that are nucleated     Assessment & Plan:  I personally reviewed all images and lab data in the [facility] system as well as any outside material available during this office visit and agree with the  radiology impressions.   Low mean corpuscular volume (MCV) We will plan follow-up blood counts on this patient including peripheral smear evaluations  Leukopenia Repeat CBC  Nephrolithiasis Currently stable without  hemorrhage   Jaylinn was seen today for follow-up.  Diagnoses and all orders for this visit:  History of calculus of kidney during pregnancy -     Comprehensive metabolic panel; Future  Low mean corpuscular volume (MCV) -     CBC with Differential/Platelet; Future  Leukopenia, unspecified type -     CBC with Differential/Platelet; Future  Anemia, unspecified type -     CBC with Differential/Platelet; Future  PULMONARY SARCOIDOSIS -     Comprehensive metabolic panel; Future -     CBC with Differential/Platelet; Future  Hypercalcemia -     Comprehensive metabolic panel; Future  Nephrolithiasis     Follow Up  Instructions: Patient knows to come in for lab work and follow-up visit in February Patient states she will get a Covid vaccine in 2 months   I discussed the assessment and treatment plan with the patient. The patient was provided an opportunity to ask questions and all were answered. The patient agreed with the plan and demonstrated an understanding of the instructions.   The patient was advised to call back or seek an in-person evaluation if the symptoms worsen or if the condition fails to improve as anticipated.  I provided 30 minutes of non-face-to-face time during this encounter  including  median intraservice time , review of notes, labs, imaging, medications  and explaining diagnosis and management to the patient .    Asencion Noble, MD

## 2020-02-24 NOTE — Assessment & Plan Note (Signed)
We will plan follow-up blood counts on this patient including peripheral smear evaluations

## 2020-02-29 ENCOUNTER — Ambulatory Visit: Payer: No Typology Code available for payment source | Attending: Critical Care Medicine

## 2020-02-29 ENCOUNTER — Other Ambulatory Visit: Payer: Self-pay

## 2020-02-29 DIAGNOSIS — D72819 Decreased white blood cell count, unspecified: Secondary | ICD-10-CM

## 2020-02-29 DIAGNOSIS — D649 Anemia, unspecified: Secondary | ICD-10-CM

## 2020-02-29 DIAGNOSIS — D869 Sarcoidosis, unspecified: Secondary | ICD-10-CM

## 2020-02-29 DIAGNOSIS — Z8759 Personal history of other complications of pregnancy, childbirth and the puerperium: Secondary | ICD-10-CM

## 2020-02-29 DIAGNOSIS — R718 Other abnormality of red blood cells: Secondary | ICD-10-CM

## 2020-03-01 LAB — COMPREHENSIVE METABOLIC PANEL
ALT: 11 IU/L (ref 0–32)
AST: 18 IU/L (ref 0–40)
Albumin/Globulin Ratio: 1.4 (ref 1.2–2.2)
Albumin: 4.7 g/dL (ref 3.8–4.8)
Alkaline Phosphatase: 53 IU/L (ref 44–121)
BUN/Creatinine Ratio: 13 (ref 9–23)
BUN: 16 mg/dL (ref 6–20)
Bilirubin Total: 1 mg/dL (ref 0.0–1.2)
CO2: 22 mmol/L (ref 20–29)
Calcium: 9.8 mg/dL (ref 8.7–10.2)
Chloride: 98 mmol/L (ref 96–106)
Creatinine, Ser: 1.21 mg/dL — ABNORMAL HIGH (ref 0.57–1.00)
GFR calc Af Amer: 66 mL/min/{1.73_m2} (ref >59)
GFR calc non Af Amer: 57 mL/min/{1.73_m2} — ABNORMAL LOW (ref >59)
Globulin, Total: 3.3 g/dL (ref 1.5–4.5)
Glucose: 74 mg/dL (ref 65–99)
Potassium: 4.2 mmol/L (ref 3.5–5.2)
Sodium: 135 mmol/L (ref 134–144)
Total Protein: 8 g/dL (ref 6.0–8.5)

## 2020-03-01 LAB — CBC WITH DIFFERENTIAL/PLATELET
Basophils Absolute: 0 10*3/uL (ref 0.0–0.2)
Basos: 1 %
EOS (ABSOLUTE): 0.1 10*3/uL (ref 0.0–0.4)
Eos: 3 %
Hematocrit: 41 % (ref 34.0–46.6)
Hemoglobin: 12.9 g/dL (ref 11.1–15.9)
Immature Grans (Abs): 0 10*3/uL (ref 0.0–0.1)
Immature Granulocytes: 1 %
Lymphocytes Absolute: 1.5 10*3/uL (ref 0.7–3.1)
Lymphs: 34 %
MCH: 24.6 pg — ABNORMAL LOW (ref 26.6–33.0)
MCHC: 31.5 g/dL (ref 31.5–35.7)
MCV: 78 fL — ABNORMAL LOW (ref 79–97)
Monocytes Absolute: 0.4 10*3/uL (ref 0.1–0.9)
Monocytes: 10 %
Neutrophils Absolute: 2.3 10*3/uL (ref 1.4–7.0)
Neutrophils: 51 %
Platelets: 213 10*3/uL (ref 150–450)
RBC: 5.25 x10E6/uL (ref 3.77–5.28)
RDW: 13.8 % (ref 11.7–15.4)
WBC: 4.3 10*3/uL (ref 3.4–10.8)

## 2021-01-18 IMAGING — DX DG ABDOMEN 1V
1 series · 1 of 1 positions shown · non-contrast
Comparison: CT of the abdomen pelvis at [REDACTED].

CLINICAL DATA: Nephrolithiasis. Right staghorn calculus.

EXAM:
ABDOMEN - 1 VIEW

[abdomen kub]
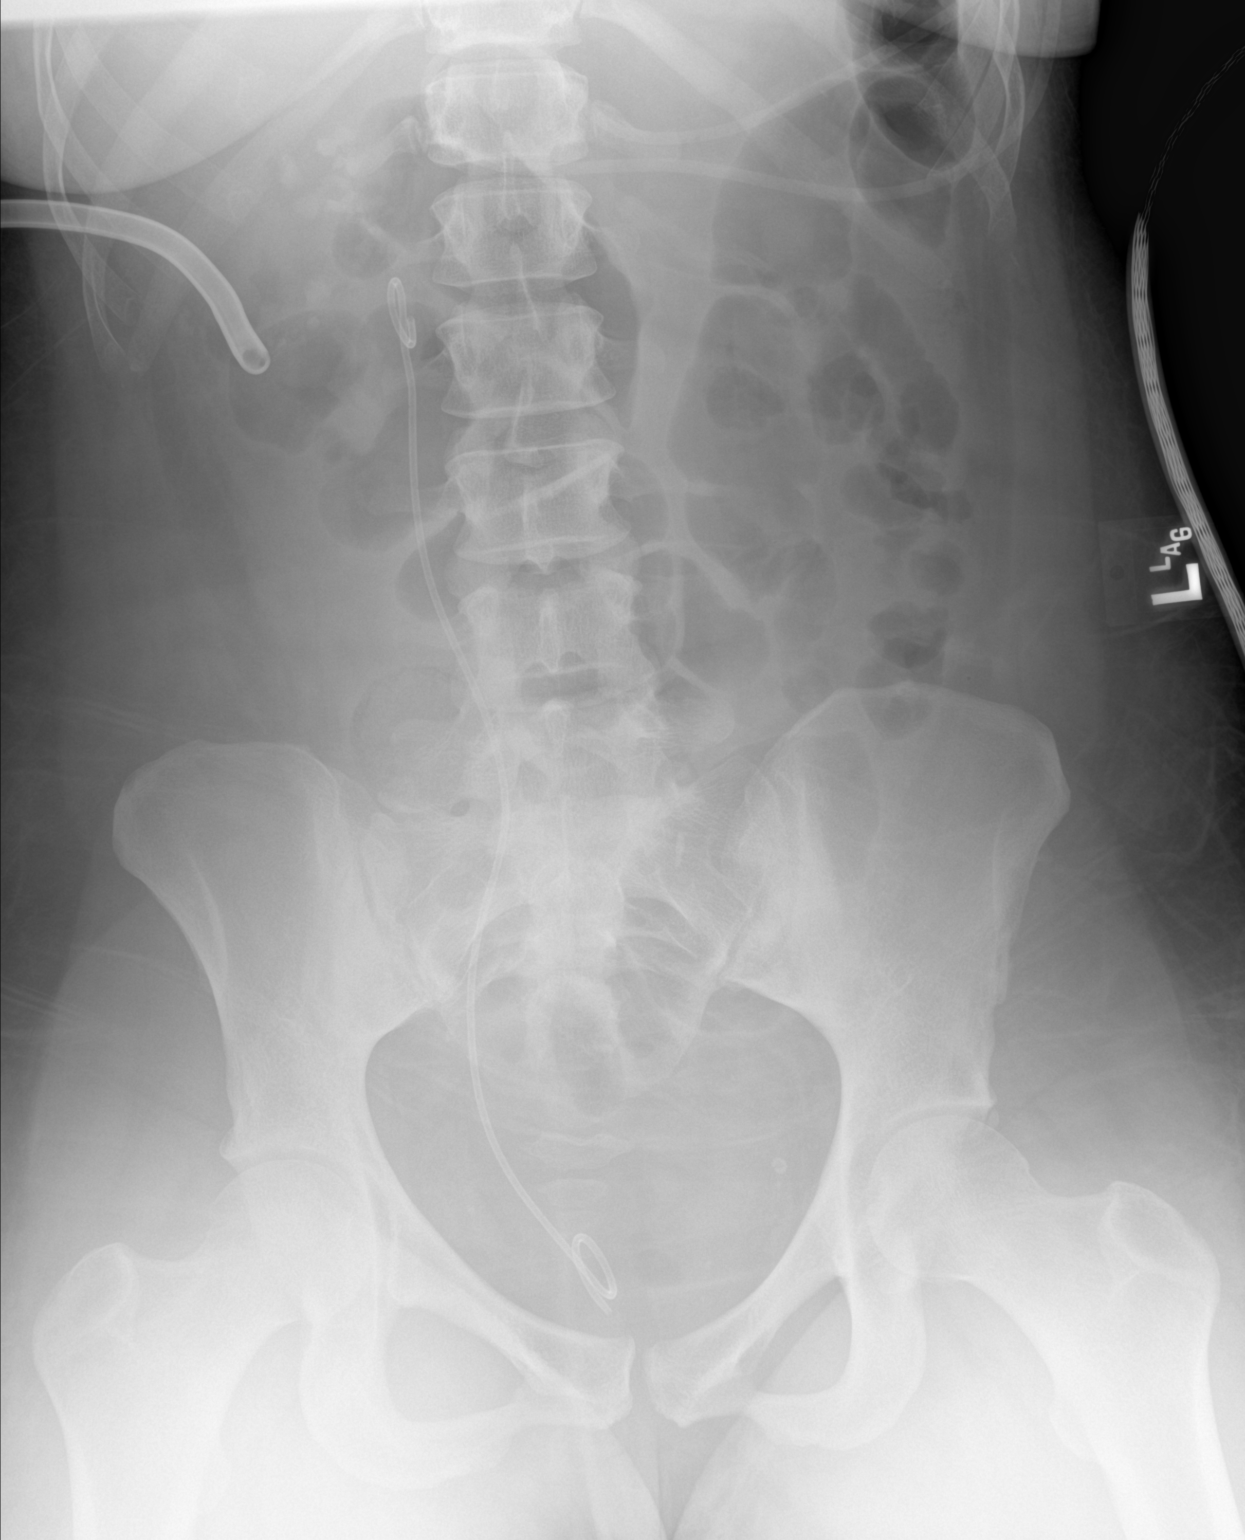

[1 of 1 positions shown; findings below may reference images not displayed]

FINDINGS: Bowel gas pattern is normal. Large more nephrostomy tube present. A
double-J ureteral stent is in place. Staghorn calculi are no longer
evident. Smaller stone fragments remain, more prominent at the upper
pole. Axial skeleton is within normal limits.
IMPRESSION: 1. Right double-J ureteral stent in place.
2. Staghorn calculi are no longer evident.
3. Smaller stone fragments are present, predominantly at the upper
pole following nephrolithotomy.

## 2021-04-16 ENCOUNTER — Ambulatory Visit: Payer: Medicaid Other | Admitting: Critical Care Medicine

## 2021-06-05 ENCOUNTER — Ambulatory Visit: Payer: Commercial Managed Care - PPO | Admitting: Critical Care Medicine

## 2021-06-05 DIAGNOSIS — N1831 Chronic kidney disease, stage 3a: Secondary | ICD-10-CM | POA: Insufficient documentation

## 2021-06-05 DIAGNOSIS — E7209 Other disorders of amino-acid transport: Secondary | ICD-10-CM | POA: Insufficient documentation

## 2021-06-05 NOTE — Progress Notes (Incomplete)
? ?Established Patient Office Visit ? ?Subjective:  ?Patient ID: Kara Atkinson, female    DOB: [birth year]  Age: 42 y.o. MRN: [id number] ? ?CC: No chief complaint on file. ? ? ?HPI ?Kara Atkinson presents for  ?Not seen since 02/2020 ? ?Low mean corpuscular volume (MCV) ?We will plan follow-up blood counts on this patient including peripheral smear evaluations ?  ?Leukopenia ?Repeat CBC ?  ?Nephrolithiasis ?Currently stable without hemorrhage ?  ?  ?Kara Atkinson was seen today for follow-up. ?  ?Diagnoses and all orders for this visit: ?  ?History of calculus of kidney during pregnancy ?-     Comprehensive metabolic panel; Future ?  ?Low mean corpuscular volume (MCV) ?-     CBC with Differential/Platelet; Future ?  ?Leukopenia, unspecified type ?-     CBC with Differential/Platelet; Future ?  ?Anemia, unspecified type ?-     CBC with Differential/Platelet; Future ?  ?PULMONARY SARCOIDOSIS ?-     Comprehensive metabolic panel; Future ?-     CBC with Differential/Platelet; Future ?  ?Hypercalcemia ?-     Comprehensive metabolic panel; Future ?  ?Nephrolithiasis ?  ?[facility] clinic: ?Referral to [facility] Multidisciplinary stone clinic ? ?Kidney Stone Prevention  ?The following dietary changes may decrease your risk of forming stones.  ? ?1. Increase your fluid intake to around 2.5 - 3 liters per day. Diluting the urine hinders the formation of stones. You should drink enough fluid to make 2 liters of urine a day. You know you are drinking enough fluid if your urine is clear.  ? ?2. Decrease your salt intake to less than 2000mg /day  ?This will decrease the amount of calcium excreted in your urine. Eat more fresh or frozen vegetables instead of canned. Eat less processed meats. At restaurants request your food to be prepared without salt or high sodium seasoning.  ? ?3. Limit the amount of Oxalate containing foods in your diet to 50mg  per day.  ?This will decrease the amount of oxalate excreted in your urine.  ? ?Nuts  ?Peanut Butter   ?Chocolate  ?Spinach  ?Rhubarb  ?Tea  ?Black Pepper  ?Strawberries  ?Tofu  ? ?4. Limit the amount of animal proteins in your diet to less than 15 oz per day.  ?This will decrease the amount of uric acid excreted in your urine.  ? ?Fish  ?Liver  ?Chicken  ?Red Meat - no more than 3 servings per week  ? ?5. Increase the amount of citrate in your diet.  ?This will decrease the amount of calcium in your urine. 1/2 cup concentrated lemon juice to 2 quarts of water. Avoid sweetened products as these may negate the benefit.  ? ?6. Eat the recommended daily allowance of calcium each day. ?Calcium in your diet binds oxalate in your gut and decreases the amount excreted in your urine. Oxalate is often one of the components of kidney stones. Even though you may form calcium kidney stones decreasing the amount of calcium in your diet will lead to higher amounts of oxalate in your urine and potentially increase your risk of recurrent stone disease.   ?  ?Past Medical History:  ?Diagnosis Date  ?? Glucose-6-phosphate dehydrogenase deficiency   ?? Kidney stone   ?? Pulmonary sarcoidosis (HCC)   ? ? ?Past Surgical History:  ?Procedure Laterality Date  ?? cystine stone removal    ?? CYSTOSCOPY N/A 02/17/2019  ? Procedure: CYSTOSCOPY FLEXIBLE;  Surgeon: , MD;  Location: [facility];  Service: Urology;  Laterality: N/A;  ?? HOLMIUM LASER APPLICATION Right 02/17/2019  ? Procedure: HOLMIUM LASER APPLICATION;  Surgeon: Crist Fat, MD;  Location: [facility];  Service: Urology;  Laterality: Right;  ?? NEPHROLITHOTOMY Right 02/17/2019  ? Procedure: NEPHROLITHOTOMY PERCUTANEOUS WITH SURGEON ACCESS;  Surgeon: Crist Fat, MD;  Location: [facility];  Service: Urology;  Laterality: Right;  ? ? ?Family History  ?Problem Relation Age of Onset  ?? Sarcoidosis Other   ?     cousin  ? ? ?Social History  ? ?Socioeconomic History  ?? Marital status: Married  ?  Spouse name: Not on file  ?? Number of children: Not on file  ?? Years  of education: Not on file  ?? Highest education level: Not on file  ?Occupational History  ?? Occupation: CNA  ?Tobacco Use  ?? Smoking status: Former  ?  Packs/day: 0.50  ?  Years: 10.00  ?  Pack years: 5.00  ?  Types: Cigarettes  ?  Quit date: 10/16/2008  ?  Years since quitting: 12.6  ?? Smokeless tobacco: Never  ?Vaping Use  ?? Vaping Use: Never used  ?Substance and Sexual Activity  ?? Alcohol use: No  ?? Drug use: No  ?? Sexual activity: Yes  ?Other Topics Concern  ?? Not on file  ?Social History Narrative  ?? Not on file  ? ?Social Determinants of Health  ? ?Financial Resource Strain: Not on file  ?Food Insecurity: Not on file  ?Transportation Needs: Not on file  ?Physical Activity: Not on file  ?Stress: Not on file  ?Social Connections: Not on file  ?Intimate Partner Violence: Not on file  ? ? ?No outpatient medications prior to visit.  ? ?No facility-administered medications prior to visit.  ? ? ?Allergies  ?Allergen Reactions  ?? Sulfonamide Derivatives Other (See Comments)  ?  Patient denies allergy  ? ? ?ROS ?Review of Systems ? ?  ?Objective:  ?  ?Physical Exam ? ?There were no vitals taken for this visit. ?Wt Readings from Last 3 Encounters:  ?05/11/19 173 lb (78.5 kg)  ?02/18/19 176 lb (79.8 kg)  ?02/16/19 176 lb (79.8 kg)  ? ? ? ?Health Maintenance Due  ?Topic Date Due  ?? COVID-19 Vaccine (1) Never done  ?? Hepatitis C Screening  Never done  ? ? ?There are no preventive care reminders to display for this patient. ? ?No results found for: TSH ?Lab Results  ?Component Value Date  ? WBC 4.3 02/29/2020  ? HGB 12.9 02/29/2020  ? HCT 41.0 02/29/2020  ? MCV 78 (L) 02/29/2020  ? PLT 213 02/29/2020  ? ?Lab Results  ?Component Value Date  ? NA 135 02/29/2020  ? K 4.2 02/29/2020  ? CO2 22 02/29/2020  ? GLUCOSE 74 02/29/2020  ? BUN 16 02/29/2020  ? CREATININE 1.21 (H) 02/29/2020  ? BILITOT 1.0 02/29/2020  ? ALKPHOS 53 02/29/2020  ? AST 18 02/29/2020  ? ALT 11 02/29/2020  ? PROT 8.0 02/29/2020  ? ALBUMIN 4.7  02/29/2020  ? CALCIUM 9.8 02/29/2020  ? ANIONGAP 9 02/19/2019  ? ?No results found for: CHOL ?No results found for: HDL ?No results found for: LDLCALC ?No results found for: TRIG ?No results found for: CHOLHDL ?No results found for: HGBA1C ? ?  ?Assessment & Plan:  ? ?Problem List Items Addressed This Visit   ?None ? ? ?No orders of the defined types were placed in this encounter. ? ? ?Follow-up: No follow-ups on file.  ? ? ?Shan Levans, MD ?

## 2022-02-28 ENCOUNTER — Encounter: Payer: Self-pay | Admitting: Critical Care Medicine

## 2022-02-28 ENCOUNTER — Ambulatory Visit: Payer: Commercial Managed Care - PPO | Attending: Critical Care Medicine | Admitting: Critical Care Medicine

## 2022-02-28 VITALS — BP 109/69 | HR 75 | Ht 64.0 in | Wt 179.4 lb

## 2022-02-28 DIAGNOSIS — D869 Sarcoidosis, unspecified: Secondary | ICD-10-CM | POA: Diagnosis not present

## 2022-02-28 DIAGNOSIS — D75A Glucose-6-phosphate dehydrogenase (G6PD) deficiency without anemia: Secondary | ICD-10-CM | POA: Diagnosis not present

## 2022-02-28 DIAGNOSIS — D72819 Decreased white blood cell count, unspecified: Secondary | ICD-10-CM

## 2022-02-28 DIAGNOSIS — N200 Calculus of kidney: Secondary | ICD-10-CM | POA: Insufficient documentation

## 2022-02-28 DIAGNOSIS — N1831 Chronic kidney disease, stage 3a: Secondary | ICD-10-CM

## 2022-02-28 DIAGNOSIS — D649 Anemia, unspecified: Secondary | ICD-10-CM

## 2022-02-28 DIAGNOSIS — R718 Other abnormality of red blood cells: Secondary | ICD-10-CM

## 2022-02-28 DIAGNOSIS — Z110 Encounter for screening for intestinal infectious diseases: Secondary | ICD-10-CM

## 2022-02-28 DIAGNOSIS — E7209 Other disorders of amino-acid transport: Secondary | ICD-10-CM

## 2022-02-28 DIAGNOSIS — Z1159 Encounter for screening for other viral diseases: Secondary | ICD-10-CM

## 2022-02-28 NOTE — Assessment & Plan Note (Signed)
Pulmonary sarcoidosis stable at this time plan for this patient will be to obtain an ACE level and continued observation

## 2022-02-28 NOTE — Assessment & Plan Note (Signed)
-   Has follow up with urology

## 2022-02-28 NOTE — Assessment & Plan Note (Signed)
Reassess CBC 

## 2022-02-28 NOTE — Assessment & Plan Note (Signed)
Reassess renal function 

## 2022-02-28 NOTE — Patient Instructions (Signed)
No new medications needed  Complete screening labs obtained and also hepatitis C levels will be checked and pulmonary sarcoidosis will be assessed by an ACE level  We will call you with results  Return to Dr. Delford Field 1 year or sooner if necessary

## 2022-02-28 NOTE — Progress Notes (Signed)
Established Patient Office Visit  Subjective   Patient ID: Kara Atkinson, female    DOB: December 05, 1980  Age: 41 y.o. MRN: [id number]  Sarcoidosis FU  41 y.o.F hx of pulmonary sarcoidosis f/u not seen since 2021  Patient seen in return follow-up and has had no change in her shortness of breath she has had no lymphadenopathy no skin lesions no joint pain.  She would like follow-up lab testing.  She has had difficulty with assisting kidney stone in the right kidney and is seeing a urologist in [place] in the [facility] system.  We have access to those notes.  In the left kidney she has a lower pole 1.2 cm stone.  She is on potassium citrate to reduce stone size dietary change and increase fluid intake.  The stone may have to be removed.  She does need hepatitis C screening.  She also needs lab screenings to follow-up on her lab data.  She does have G6PD deficiency needs blood counts.  She is not on any systemic therapy for sarcoid.  She does have stage IIIa chronic kidney disease that needs follow-up as well.  She has had leukopenia and anemia and thrombocytopenia before.  There are no other complaints.         Review of Systems  Constitutional:  Negative for chills, diaphoresis, fever, malaise/fatigue and weight loss.  HENT:  Negative for congestion, hearing loss, nosebleeds, sore throat and tinnitus.   Eyes:  Negative for blurred vision, photophobia and redness.  Respiratory:  Negative for cough, hemoptysis, sputum production, shortness of breath, wheezing and stridor.   Cardiovascular:  Negative for chest pain, palpitations, orthopnea, claudication, leg swelling and PND.  Gastrointestinal:  Negative for abdominal pain, blood in stool, constipation, diarrhea, heartburn, nausea and vomiting.  Genitourinary:  Negative for dysuria, flank pain, frequency, hematuria and urgency.  Musculoskeletal:  Negative for back pain, falls, joint pain, myalgias and neck pain.  Skin:  Negative for itching and rash.   Neurological:  Negative for dizziness, tingling, tremors, sensory change, speech change, focal weakness, seizures, loss of consciousness, weakness and headaches.  Endo/Heme/Allergies:  Negative for environmental allergies and polydipsia. Does not bruise/bleed easily.  Psychiatric/Behavioral:  Negative for depression, memory loss, substance abuse and suicidal ideas. The patient is not nervous/anxious and does not have insomnia.       Objective:     BP 109/69   Pulse 75   Ht 5\' 4"  (1.626 m)   Wt 179 lb 6.4 oz (81.4 kg)   LMP 02/18/2022 (Exact Date)   SpO2 100%   BMI 30.79 kg/m    Physical Exam Vitals reviewed.  Constitutional:      Appearance: Normal appearance. She is well-developed. She is not diaphoretic.  HENT:     Head: Normocephalic and atraumatic.     Nose: No nasal deformity, septal deviation, mucosal edema or rhinorrhea.     Right Sinus: No maxillary sinus tenderness or frontal sinus tenderness.     Left Sinus: No maxillary sinus tenderness or frontal sinus tenderness.     Mouth/Throat:     Pharynx: No oropharyngeal exudate.  Eyes:     General: No scleral icterus.    Conjunctiva/sclera: Conjunctivae normal.     Pupils: Pupils are equal, round, and reactive to light.  Neck:     Thyroid: No thyromegaly.     Vascular: No carotid bruit or JVD.     Trachea: Trachea normal. No tracheal tenderness or tracheal deviation.  Cardiovascular:     Rate  and Rhythm: Normal rate and regular rhythm.     Chest Wall: PMI is not displaced.     Pulses: Normal pulses. No decreased pulses.     Heart sounds: Normal heart sounds, S1 normal and S2 normal. Heart sounds not distant. No murmur heard.    No systolic murmur is present.     No diastolic murmur is present.     No friction rub. No gallop. No S3 or S4 sounds.  Pulmonary:     Effort: No tachypnea, accessory muscle usage or respiratory distress.     Breath sounds: No stridor. No decreased breath sounds, wheezing, rhonchi or rales.   Chest:     Chest wall: No tenderness.  Abdominal:     General: Bowel sounds are normal. There is no distension.     Palpations: Abdomen is soft. Abdomen is not rigid.     Tenderness: There is no abdominal tenderness. There is no guarding or rebound.  Musculoskeletal:        General: Normal range of motion.     Cervical back: Normal range of motion and neck supple. No edema, erythema or rigidity. No muscular tenderness. Normal range of motion.  Lymphadenopathy:     Head:     Right side of head: No submental, submandibular, tonsillar, preauricular or posterior auricular adenopathy.     Left side of head: No submental or submandibular adenopathy.     Cervical: No cervical adenopathy.     Right cervical: No superficial, deep or posterior cervical adenopathy.    Left cervical: No superficial, deep or posterior cervical adenopathy.     Upper Body:     Right upper body: No supraclavicular, axillary, pectoral or epitrochlear adenopathy.     Left upper body: No supraclavicular, axillary, pectoral or epitrochlear adenopathy.     Lower Body: No right inguinal adenopathy. No left inguinal adenopathy.  Skin:    General: Skin is warm and dry.     Coloration: Skin is not pale.     Findings: No rash.     Nails: There is no clubbing.  Neurological:     Mental Status: She is alert and oriented to person, place, and time.     Sensory: No sensory deficit.  Psychiatric:        Speech: Speech normal.        Behavior: Behavior normal.      No results found for any visits on 02/28/22.    The ASCVD Risk score (Arnett DK, et al., 2019) failed to calculate for the following reasons:   Cannot find a previous HDL lab   Cannot find a previous total cholesterol lab    Assessment & Plan:   Problem List Items Addressed This Visit       Genitourinary   Stage 3a chronic kidney disease (HCC)    Reassess renal function      Relevant Orders   Comprehensive metabolic panel     Other   PULMONARY  SARCOIDOSIS - Primary    Pulmonary sarcoidosis stable at this time plan for this patient will be to obtain an ACE level and continued observation      Relevant Orders   Angiotensin converting enzyme   CBC with Differential/Platelet   Low mean corpuscular volume (MCV)    Reassess CBC      Relevant Orders   CBC with Differential/Platelet   Leukopenia   Relevant Orders   CBC with Differential/Platelet   Anemia   Relevant Orders   CBC with  Differential/Platelet   Cystine stones (HCC)    Has follow-up with urology      G6PD deficiency    Reassess CBC      Relevant Orders   CBC with Differential/Platelet   Other Visit Diagnoses     Need for hepatitis C screening test       Relevant Orders   HCV Ab w Reflex to Quant PCR   Cholera screening       Relevant Orders   Lipid panel       Return in about 1 year (around 03/01/2023) for sarcoidosis.    Shan Levans, MD

## 2022-02-28 NOTE — Progress Notes (Signed)
Blood work No flu

## 2022-03-01 ENCOUNTER — Other Ambulatory Visit: Payer: Self-pay | Admitting: Critical Care Medicine

## 2022-03-01 LAB — CBC WITH DIFFERENTIAL/PLATELET
Basophils Absolute: 0 10*3/uL (ref 0.0–0.2)
Basos: 1 %
EOS (ABSOLUTE): 0.1 10*3/uL (ref 0.0–0.4)
Eos: 2 %
Hematocrit: 44.3 % (ref 34.0–46.6)
Hemoglobin: 13.2 g/dL (ref 11.1–15.9)
Immature Grans (Abs): 0 10*3/uL (ref 0.0–0.1)
Immature Granulocytes: 0 %
Lymphocytes Absolute: 1.2 10*3/uL (ref 0.7–3.1)
Lymphs: 39 %
MCH: 23.7 pg — ABNORMAL LOW (ref 26.6–33.0)
MCHC: 29.8 g/dL — ABNORMAL LOW (ref 31.5–35.7)
MCV: 80 fL (ref 79–97)
Monocytes Absolute: 0.2 10*3/uL (ref 0.1–0.9)
Monocytes: 7 %
Neutrophils Absolute: 1.6 10*3/uL (ref 1.4–7.0)
Neutrophils: 51 %
Platelets: 169 10*3/uL (ref 150–450)
RBC: 5.56 x10E6/uL — ABNORMAL HIGH (ref 3.77–5.28)
RDW: 13.3 % (ref 11.7–15.4)
WBC: 3.1 10*3/uL — ABNORMAL LOW (ref 3.4–10.8)

## 2022-03-01 LAB — LIPID PANEL
Chol/HDL Ratio: 2.9 ratio (ref 0.0–4.4)
Cholesterol, Total: 301 mg/dL — ABNORMAL HIGH (ref 100–199)
HDL: 105 mg/dL (ref >39)
LDL Chol Calc (NIH): 186 mg/dL — ABNORMAL HIGH (ref 0–99)
Triglycerides: 70 mg/dL (ref 0–149)
VLDL Cholesterol Cal: 10 mg/dL (ref 5–40)

## 2022-03-01 LAB — COMPREHENSIVE METABOLIC PANEL
ALT: 13 IU/L (ref 0–32)
AST: 24 IU/L (ref 0–40)
Albumin/Globulin Ratio: 1.5 (ref 1.2–2.2)
Albumin: 4.9 g/dL (ref 3.9–4.9)
Alkaline Phosphatase: 48 IU/L (ref 44–121)
BUN/Creatinine Ratio: 11 (ref 9–23)
BUN: 14 mg/dL (ref 6–24)
Bilirubin Total: 1.4 mg/dL — ABNORMAL HIGH (ref 0.0–1.2)
CO2: 20 mmol/L (ref 20–29)
Calcium: 10 mg/dL (ref 8.7–10.2)
Chloride: 100 mmol/L (ref 96–106)
Creatinine, Ser: 1.28 mg/dL — ABNORMAL HIGH (ref 0.57–1.00)
Globulin, Total: 3.3 g/dL (ref 1.5–4.5)
Glucose: 85 mg/dL (ref 70–99)
Potassium: 4.1 mmol/L (ref 3.5–5.2)
Sodium: 138 mmol/L (ref 134–144)
Total Protein: 8.2 g/dL (ref 6.0–8.5)
eGFR: 54 mL/min/{1.73_m2} — ABNORMAL LOW (ref >59)

## 2022-03-01 LAB — HCV INTERPRETATION

## 2022-03-01 LAB — HCV AB W REFLEX TO QUANT PCR: HCV Ab: NONREACTIVE

## 2022-03-01 MED ORDER — ATORVASTATIN CALCIUM 20 MG PO TABS: 20.0000 mg | ORAL_TABLET | Freq: Every day | ORAL | 3 refills | Status: AC

## 2022-03-01 NOTE — Progress Notes (Signed)
Let pt know she still has small red cells  due to G7pD deficiency. Nothong needed now. Hep c negative. She has very high cholesterol. Medication to be sent.to her p harmacy. All other labs normal

## 2022-03-05 ENCOUNTER — Telehealth: Payer: Self-pay

## 2022-03-05 NOTE — Telephone Encounter (Signed)
-----   Message from Storm Frisk, MD sent at 03/01/2022  9:34 AM EST ----- Let pt know she still has small red cells  due to G7pD deficiency. Nothong needed now. Hep c negative. She has very high cholesterol. Medication to be sent.to her p harmacy. All other labs normal

## 2022-03-05 NOTE — Telephone Encounter (Signed)
Pt was called and vm was left, Information has been sent to nurse pool.   

## 2023-03-04 ENCOUNTER — Ambulatory Visit: Payer: Commercial Managed Care - PPO | Admitting: Critical Care Medicine

## 2023-03-04 ENCOUNTER — Ambulatory Visit: Payer: Commercial Managed Care - PPO | Admitting: Internal Medicine

## 2023-05-06 ENCOUNTER — Ambulatory Visit: Payer: Commercial Managed Care - PPO | Admitting: Internal Medicine

## 2023-07-01 ENCOUNTER — Encounter: Payer: Self-pay | Admitting: Internal Medicine

## 2023-07-01 ENCOUNTER — Ambulatory Visit: Payer: Commercial Managed Care - PPO | Attending: Internal Medicine | Admitting: Internal Medicine

## 2023-07-01 VITALS — BP 112/73 | HR 70 | Temp 97.9°F | Ht 64.0 in | Wt 169.0 lb

## 2023-07-01 DIAGNOSIS — R42 Dizziness and giddiness: Secondary | ICD-10-CM | POA: Diagnosis not present

## 2023-07-01 DIAGNOSIS — Z1231 Encounter for screening mammogram for malignant neoplasm of breast: Secondary | ICD-10-CM

## 2023-07-01 DIAGNOSIS — F411 Generalized anxiety disorder: Secondary | ICD-10-CM

## 2023-07-01 DIAGNOSIS — E782 Mixed hyperlipidemia: Secondary | ICD-10-CM

## 2023-07-01 DIAGNOSIS — D869 Sarcoidosis, unspecified: Secondary | ICD-10-CM

## 2023-07-01 DIAGNOSIS — M65312 Trigger thumb, left thumb: Secondary | ICD-10-CM

## 2023-07-01 MED ORDER — SERTRALINE HCL 50 MG PO TABS: ORAL_TABLET | ORAL | 1 refills | Status: DC

## 2023-07-01 MED ORDER — HYDROXYZINE PAMOATE 25 MG PO CAPS: 25.0000 mg | ORAL_CAPSULE | Freq: Every day | ORAL | 0 refills | Status: DC | PRN

## 2023-07-01 NOTE — Patient Instructions (Signed)
 VISIT SUMMARY:  During your visit, we discussed your dizziness and lightheadedness, anxiety, chronic kidney disease, sarcoidosis, hyperlipidemia, and trigger finger. We reviewed your symptoms and made a plan to address each issue, including new medications and lifestyle recommendations.  YOUR PLAN:  -DIZZINESS AND LIGHTHEADEDNESS: Your dizziness and lightheadedness may be related to anxiety and not drinking enough fluids. We will check your blood count and thyroid function to rule out other causes. Please make sure to drink 4-8 glasses of water daily.  -ANXIETY: Anxiety can cause physical symptoms like dizziness. We are starting you on Zoloft, which you will take 25 mg daily and increase to 50 mg after 2-3 weeks. Hydroxyzine is prescribed for acute anxiety, but it may cause drowsiness. Counseling is also recommended, and you have been given a handout for behavioral health providers.  -CHRONIC KIDNEY DISEASE: Your medications have been chosen to avoid affecting your kidney function. No new treatment is required at this time.  -SARCOIDOSIS: Your sarcoidosis is currently not causing symptoms, so no treatment is needed right now.  -HYPERLIPIDEMIA: You have high cholesterol, which may be due to family history. You are not taking atorvastatin, so we discussed focusing on diet and exercise. We will recheck your cholesterol levels.  -TRIGGER FINGER: The locking sensation in your left thumb is likely due to a condition called trigger finger, possibly from a weightlifting injury. You will be referred to orthopedics for further evaluation and management.  -GENERAL HEALTH MAINTENANCE: You are due for a Pap smear and a mammogram. We also discussed your dietary habits.  INSTRUCTIONS:  Please follow up with the recommended blood tests (CBC and thyroid function tests). Schedule your Pap smear and mammogram. Drink 4-8 glasses of water daily. Start taking Zoloft as prescribed and increase the dose after 2-3  weeks. Use hydroxyzine as needed for acute anxiety, but be cautious of drowsiness. Follow up with an orthopedic specialist for your trigger finger. Recheck your cholesterol levels as discussed.

## 2023-07-01 NOTE — Progress Notes (Signed)
 Patient ID: Kara Atkinson, female    DOB: 04-02-1980  MRN: [id number]  CC: Sarcoidosis (Sarcoidosis f/u. Kara Atkinson, lightheadedness X2 weeks/Yes to pap. Yes to mamogram. Yes to colonoscopy. /No to pneumonia vax)   Subjective: Kara Atkinson is a 43 y.o. female who presents for chronic ds management.  Previous PCP was Dr. Delford Field. Her concerns today include:  Patient with history of CKD 3a, G6PD deficiency, pulmonary sarcoidosis, former smoker, HL, cystine kidney stones  Discussed the use of AI scribe software for clinical note transcription with the patient, who gave verbal consent to proceed.  History of Present Illness   Kara Atkinson, a patient with a history of sarcoidosis, G6PD deficiency, and kidney stones, presents with dizziness and lightheadedness that she has been experiencing for the past two weeks. She describes the sensation as feeling off-balance, particularly when sitting down. The dizziness lasts for extended periods and tends to subside when she gets up and moves around.  No dizziness with position changes or when rolling over in bed.  Menses are light lasting 3 to 4 days.  She believes this may be related to her anxiety, which she describes as a constant state of tension.  "I just do not know how to relax."  She has no hobbies.  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  Being with her grandson brings her joy. She is a former smoker and exercises regularly, but admits to not drinking enough fluids during the day. She has stopped taking her previous medications, Prozac and Buspar, due to concerns about her kidney function. She is open to trying medication again and is also interested in counseling for her anxiety.   She also reports a locking sensation in her left thumb, which has been ongoing for about a year. She believes this may have been caused by an injury from lifting weights.   She has history of pulmonary sarcoidosis but has not been bothered by this at all.  She denies any  shortness of breath or chronic cough.  Not on any inhalers.  HL: Lipid profile done 2023 showed a total cholesterol of 301 and an LDL cholesterol of 186.  Dr. Delford Field had prescribed atorvastatin but she never took it.     Patient Active Problem List   Diagnosis Date Noted   G6PD deficiency 02/28/2022   Cystine stones (HCC) 06/05/2021   Stage 3a chronic kidney disease (HCC) 06/05/2021   Anemia 05/11/2019   Low mean corpuscular volume (MCV) 12/24/2017   Leukopenia 12/24/2017   PULMONARY SARCOIDOSIS 09/06/2009     Current Outpatient Medications on File Prior to Visit  Medication Sig Dispense Refill   atorvastatin (LIPITOR) 20 MG tablet Take 1 tablet (20 mg total) by mouth daily. (Patient not taking: Reported on 07/01/2023) 90 tablet 3   busPIRone (BUSPAR) 10 MG tablet Take 10 mg by mouth 2 (two) times daily. (Patient not taking: Reported on 07/01/2023)     No current facility-administered medications on file prior to visit.    Allergies  Allergen Reactions   Sulfonamide Derivatives Other (See Comments)    Patient denies allergy    Social History   Socioeconomic History   Marital status: Married    Spouse name: Not on file   Number of children: Not on file   Years of education: Not on file   Highest education level: Not on file  Occupational History   Occupation: CNA  Tobacco Use   Smoking status: Former    Current packs/day: 0.00  Average packs/day: 0.5 packs/day for 10.0 years (5.0 ttl pk-yrs)    Types: Cigarettes    Start date: 10/17/1998    Quit date: 10/16/2008    Years since quitting: 14.7   Smokeless tobacco: Never  Vaping Use   Vaping status: Never Used  Substance and Sexual Activity   Alcohol use: No   Drug use: No   Sexual activity: Yes  Other Topics Concern   Not on file  Social History Narrative   Not on file   Social Drivers of Health   Financial Resource Strain: Not on file  Food Insecurity: Not on file  Transportation Needs: Not on file  Physical  Activity: Not on file  Stress: Not on file  Social Connections: Not on file  Intimate Partner Violence: Not on file    Family History  Problem Relation Age of Onset   Sarcoidosis Other        cousin    Past Surgical History:  Procedure Laterality Date   cystine stone removal     CYSTOSCOPY N/A 02/17/2019   Procedure: CYSTOSCOPY FLEXIBLE;  Surgeon: Crist Fat, MD;  Location: [facility];  Service: Urology;  Laterality: N/A;   HOLMIUM LASER APPLICATION Right 02/17/2019   Procedure: HOLMIUM LASER APPLICATION;  Surgeon: Crist Fat, MD;  Location: [facility];  Service: Urology;  Laterality: Right;   NEPHROLITHOTOMY Right 02/17/2019   Procedure: NEPHROLITHOTOMY PERCUTANEOUS WITH SURGEON ACCESS;  Surgeon: Crist Fat, MD;  Location: [facility];  Service: Urology;  Laterality: Right;    ROS: Review of Systems Negative except as stated above  PHYSICAL EXAM: BP 112/73 (BP Location: Left Arm, Patient Position: Standing, Cuff Size: Normal)   Pulse 70   Temp 97.9 F (36.6 C) (Oral)   Ht 5\' 4"  (1.626 m)   Wt 169 lb (76.7 kg)   SpO2 98%   BMI 29.01 kg/m   Physical Exam Sitting: BP 102/68, pulse 83 Standing: BP 112/73, pulse 70 General appearance - alert, well appearing, young to middle-aged African-American female and in no distress Mental status - normal mood, behavior, speech, dress, motor activity, and thought processes Eyes: Pink conjunctiva Neck - supple, no significant adenopathy Chest - clear to auscultation, no wheezes, rales or rhonchi, symmetric air entry Heart - normal rate, regular rhythm, normal S1, S2, no murmurs, rubs, clicks or gallops Extremities - peripheral pulses normal, no pedal edema, no clubbing or cyanosis Neuro exam: Cranial nerves grossly intact.  Power 5/5 in all 4 extremities.  Gross sensation intact.  Gait is stable.  Romberg negative. MSK: Left thumb: Good range of motion.     Latest Ref Rng & Units 02/28/2022   10:01 AM 02/29/2020    9:46  AM 05/11/2019   11:13 AM  CMP  Glucose 70 - 99 mg/dL 85  74  76   BUN 6 - 24 mg/dL 14  16  15    Creatinine 0.57 - 1.00 mg/dL 1.61  0.96  0.45   Sodium 134 - 144 mmol/L 138  135  139   Potassium 3.5 - 5.2 mmol/L 4.1  4.2  4.8   Chloride 96 - 106 mmol/L 100  98  103   CO2 20 - 29 mmol/L 20  22  24    Calcium 8.7 - 10.2 mg/dL 40.9  9.8  81.1   Total Protein 6.0 - 8.5 g/dL 8.2  8.0  8.2   Total Bilirubin 0.0 - 1.2 mg/dL 1.4  1.0  1.3   Alkaline Phos 44 -  121 IU/L 48  53  56   AST 0 - 40 IU/L 24  18  41   ALT 0 - 32 IU/L 13  11  16     Lipid Panel     Component Value Date/Time   CHOL 301 (H) 02/28/2022 1001   TRIG 70 02/28/2022 1001   HDL 105 02/28/2022 1001   CHOLHDL 2.9 02/28/2022 1001   LDLCALC 186 (H) 02/28/2022 1001    CBC    Component Value Date/Time   WBC 3.1 (L) 02/28/2022 1001   WBC 10.7 (H) 02/19/2019 0545   RBC 5.56 (H) 02/28/2022 1001   RBC 2.97 (L) 02/19/2019 0545   HGB 13.2 02/28/2022 1001   HCT 44.3 02/28/2022 1001   PLT 169 02/28/2022 1001   MCV 80 02/28/2022 1001   MCH 23.7 (L) 02/28/2022 1001   MCH 26.9 02/19/2019 0545   MCHC 29.8 (L) 02/28/2022 1001   MCHC 31.9 02/19/2019 0545   RDW 13.3 02/28/2022 1001   LYMPHSABS 1.2 02/28/2022 1001   MONOABS 0.4 03/03/2018 0922   EOSABS 0.1 02/28/2022 1001   BASOSABS 0.0 02/28/2022 1001    ASSESSMENT AND PLAN: 1. Dizziness (Primary) Of questionable etiology.  Neurologic exam is nonfocal.  She has pink conjunctiva suggesting no anemia.  She does not appear dehydrated.  Patient thinks it is related to her anxiety.  However I still advised that she stay hydrated by trying to drink 4 to 8 glasses of water daily.  We will check CBC - CBC - Comprehensive metabolic panel with GFR  2. GAD (generalized anxiety disorder) Discussed management of anxiety.  Encouraged her to continue regular exercise.  Try 10 minutes of meditation every day.  Try to do something every day for 1 hour that brings her joy.  Discussed putting her  on Zoloft to take daily and hydroxyzine to take as needed.  We will start with Zoloft 50 mg half a tablet daily for the first 2 weeks then increase to the full tab.  We will have her follow-up in several weeks to reassess and to do her Pap smear. -Patient would like to pursue counseling.  I have given her a printout of behavioral health resources in the [place] area.  She can call and make appointment with one of them. - sertraline (ZOLOFT) 50 MG tablet; 1/2 tab PO daily x 2 weeks then 1 tab daily there after  Dispense: 60 tablet; Refill: 1 - hydrOXYzine (VISTARIL) 25 MG capsule; Take 1 capsule (25 mg total) by mouth daily as needed (can cause drowsiness).  Dispense: 30 capsule; Refill: 0  3. Mixed hyperlipidemia Discussed and encouraged healthy eating habits and using unsaturated oils like olive oil when she cooks.  Continue regular exercise.  We will recheck lipid profile then make a determination of statin therapy. - Lipid panel  4. Sarcoidosis Stable and asymptomatic  5. Trigger finger of left thumb - AMB referral to orthopedics  6. Encounter for screening mammogram for malignant neoplasm of breast - MM 3D SCREENING MAMMOGRAM BILATERAL BREAST; Future    Patient was given the opportunity to ask questions.  Patient verbalized understanding of the plan and was able to repeat key elements of the plan.   This documentation was completed using Paediatric nurse.  Any transcriptional errors are unintentional.  Orders Placed This Encounter  Procedures   MM 3D SCREENING MAMMOGRAM BILATERAL BREAST   CBC   Comprehensive metabolic panel with GFR   Lipid panel   AMB referral to  orthopedics     Requested Prescriptions   Signed Prescriptions Disp Refills   sertraline (ZOLOFT) 50 MG tablet 60 tablet 1    Sig: 1/2 tab PO daily x 2 weeks then 1 tab daily there after   hydrOXYzine (VISTARIL) 25 MG capsule 30 capsule 0    Sig: Take 1 capsule (25 mg total) by mouth daily  as needed (can cause drowsiness).    Return in about 6 weeks (around 08/12/2023) for PAP.  Concetta Dee, MD, FACP

## 2023-07-02 ENCOUNTER — Encounter: Payer: Self-pay | Admitting: Internal Medicine

## 2023-07-02 LAB — CBC
Hematocrit: 42.4 % (ref 34.0–46.6)
Hemoglobin: 12.9 g/dL (ref 11.1–15.9)
MCH: 23.9 pg — ABNORMAL LOW (ref 26.6–33.0)
MCHC: 30.4 g/dL — ABNORMAL LOW (ref 31.5–35.7)
MCV: 79 fL (ref 79–97)
Platelets: 188 10*3/uL (ref 150–450)
RBC: 5.39 x10E6/uL — ABNORMAL HIGH (ref 3.77–5.28)
RDW: 13.1 % (ref 11.7–15.4)
WBC: 4.1 10*3/uL (ref 3.4–10.8)

## 2023-07-02 LAB — LIPID PANEL
Chol/HDL Ratio: 2.8 ratio (ref 0.0–4.4)
Cholesterol, Total: 255 mg/dL — ABNORMAL HIGH (ref 100–199)
HDL: 91 mg/dL (ref >39)
LDL Chol Calc (NIH): 155 mg/dL — ABNORMAL HIGH (ref 0–99)
Triglycerides: 57 mg/dL (ref 0–149)
VLDL Cholesterol Cal: 9 mg/dL (ref 5–40)

## 2023-07-02 LAB — COMPREHENSIVE METABOLIC PANEL WITH GFR
ALT: 10 IU/L (ref 0–32)
AST: 19 IU/L (ref 0–40)
Albumin: 4.6 g/dL (ref 3.9–4.9)
Alkaline Phosphatase: 50 IU/L (ref 44–121)
BUN/Creatinine Ratio: 12 (ref 9–23)
BUN: 16 mg/dL (ref 6–24)
Bilirubin Total: 1 mg/dL (ref 0.0–1.2)
CO2: 26 mmol/L (ref 20–29)
Calcium: 10.2 mg/dL (ref 8.7–10.2)
Chloride: 101 mmol/L (ref 96–106)
Creatinine, Ser: 1.3 mg/dL — ABNORMAL HIGH (ref 0.57–1.00)
Globulin, Total: 3 g/dL (ref 1.5–4.5)
Glucose: 89 mg/dL (ref 70–99)
Potassium: 4.8 mmol/L (ref 3.5–5.2)
Sodium: 139 mmol/L (ref 134–144)
Total Protein: 7.6 g/dL (ref 6.0–8.5)
eGFR: 53 mL/min/{1.73_m2} — ABNORMAL LOW (ref >59)

## 2023-07-03 ENCOUNTER — Ambulatory Visit
Admission: RE | Admit: 2023-07-03 | Discharge: 2023-07-03 | Disposition: A | Discharge status: Home / Self Care | Source: Ambulatory Visit | Attending: Internal Medicine | Admitting: Internal Medicine

## 2023-07-03 DIAGNOSIS — Z1231 Encounter for screening mammogram for malignant neoplasm of breast: Secondary | ICD-10-CM

## 2023-07-09 ENCOUNTER — Encounter: Payer: Self-pay | Admitting: Internal Medicine

## 2023-07-09 ENCOUNTER — Other Ambulatory Visit: Payer: Self-pay | Admitting: Internal Medicine

## 2023-07-09 DIAGNOSIS — R928 Other abnormal and inconclusive findings on diagnostic imaging of breast: Secondary | ICD-10-CM

## 2023-07-21 ENCOUNTER — Ambulatory Visit
Admission: RE | Admit: 2023-07-21 | Discharge: 2023-07-21 | Disposition: A | Discharge status: Home / Self Care | Source: Ambulatory Visit | Attending: Internal Medicine | Admitting: Internal Medicine

## 2023-07-21 ENCOUNTER — Encounter: Payer: Self-pay | Admitting: Internal Medicine

## 2023-07-21 DIAGNOSIS — R928 Other abnormal and inconclusive findings on diagnostic imaging of breast: Secondary | ICD-10-CM

## 2023-08-05 ENCOUNTER — Ambulatory Visit (INDEPENDENT_AMBULATORY_CARE_PROVIDER_SITE_OTHER): Admitting: Orthopedic Surgery

## 2023-08-05 ENCOUNTER — Other Ambulatory Visit (INDEPENDENT_AMBULATORY_CARE_PROVIDER_SITE_OTHER)

## 2023-08-05 DIAGNOSIS — M65312 Trigger thumb, left thumb: Secondary | ICD-10-CM | POA: Diagnosis not present

## 2023-08-05 NOTE — Progress Notes (Signed)
 Kara Atkinson - 43 y.o. female MRN [id number]  Date of birth: 1980-09-08  Office Visit Note: Visit Date: 08/05/2023 PCP: Lawrance Presume, MD Referred by: Lawrance Presume, MD  Subjective: No chief complaint on file.  HPI: Kara Atkinson is a pleasant 43 y.o. female who presents today for evaluation of left thumb clicking and locking that is been present now for multiple years.  She has not undergone any significant treatments in the past.  She is quite active at baseline, does do a significant amount of exercise in the gym which involves weightlifting.  Pertinent ROS were reviewed with the patient and found to be negative unless otherwise specified above in HPI.   Visit Reason: left trigger thumb Duration of symptoms: 2 years Hand dominance: right Occupation: Nurse Diabetic: No Smoking: No Heart/Lung History: lung yes Blood Thinners: none  Prior Testing/EMG: none Injections (Date): none Treatments:none Prior Surgery: none  Assessment & Plan: Visit Diagnoses:  1. Trigger thumb, left thumb     Plan: Extensive discussion was had with the patient today regarding her left thumb trigger digit.  We discussed the etiology and pathophysiology of stenosing tenosynovitis.  We discussed conservative versus surgical treatment modalities.  From a conservative standpoint, we discussed activity modification, splinting, therapy and injections.  From a surgical standpoint, we discussed the possibility for trigger digit release as well as all risk and benefits associated.  Given that she has not trialed conservative treatments, patient is appropriate candidate for cortisone injection to the left thumb A1 pulley for symptom relief.  Risks and benefit of the cortisone injection were discussed in detail, patient agreed to proceed.  Injection was provided today without issue, patient will return in approximate 6 weeks time for a recheck.   Follow-up: No follow-ups on file.   Meds & Orders:  No orders of the defined types were placed in this encounter.   Orders Placed This Encounter  Procedures   XR Hand Complete Left     Procedures: No procedures performed      Clinical History: No specialty comments available.  She reports that she quit smoking about 14 years ago. Her smoking use included cigarettes. She started smoking about 24 years ago. She has a 5 pack-year smoking history. She has never used smokeless tobacco. No results for input(s): "HGBA1C", "LABURIC" in the last 8760 hours.  Objective:   Vital Signs: There were no vitals taken for this visit.  Physical Exam  Gen: Well-appearing, in no acute distress; non-toxic CV: Regular Rate. Well-perfused. Warm.  Resp: Breathing unlabored on room air; no wheezing. Psych: Fluid speech in conversation; appropriate affect; normal thought process  Ortho Exam Left hand: - Palpable nodule at the A1 pulley of the thumb, associated tenderness - Notable clicking with deep flexion of the thumb, no  evidence of significant locking with deep flexion - Sensation intact distally, hand remains warm well-perfused   Imaging: XR Hand Complete Left Result Date: 08/05/2023 There is no evidence of fracture or dislocation. There is no evidence of arthropathy or other focal bone abnormality. Soft tissues are unremarkable.    Past Medical/Family/Surgical/Social History: Medications & Allergies reviewed per EMR, new medications updated. Patient Active Problem List   Diagnosis Date Noted   G6PD deficiency 02/28/2022   Cystine stones (HCC) 06/05/2021   Stage 3a chronic kidney disease (HCC) 06/05/2021   Anemia 05/11/2019   Low mean corpuscular volume (MCV) 12/24/2017   Leukopenia 12/24/2017   PULMONARY SARCOIDOSIS 09/06/2009   Past Medical History:  Diagnosis Date   Glucose-6-phosphate dehydrogenase deficiency    Kidney stone    Pulmonary sarcoidosis (HCC)    Family History  Problem Relation Age of Onset   Sarcoidosis Other         cousin   Past Surgical History:  Procedure Laterality Date   cystine stone removal     CYSTOSCOPY N/A 02/17/2019   Procedure: CYSTOSCOPY FLEXIBLE;  Surgeon: Andrez Banker, MD;  Location: [facility];  Service: Urology;  Laterality: N/A;   HOLMIUM LASER APPLICATION Right 02/17/2019   Procedure: HOLMIUM LASER APPLICATION;  Surgeon: Andrez Banker, MD;  Location: [facility];  Service: Urology;  Laterality: Right;   NEPHROLITHOTOMY Right 02/17/2019   Procedure: NEPHROLITHOTOMY PERCUTANEOUS WITH SURGEON ACCESS;  Surgeon: Andrez Banker, MD;  Location: [facility];  Service: Urology;  Laterality: Right;   Social History   Occupational History   Occupation: CNA  Tobacco Use   Smoking status: Former    Current packs/day: 0.00    Average packs/day: 0.5 packs/day for 10.0 years (5.0 ttl pk-yrs)    Types: Cigarettes    Start date: 10/17/1998    Quit date: 10/16/2008    Years since quitting: 14.8   Smokeless tobacco: Never  Vaping Use   Vaping status: Never Used  Substance and Sexual Activity   Alcohol use: No   Drug use: No   Sexual activity: Yes    Wynton Hufstetler Alvia Jointer, M.D. [facility], Hand Surgery

## 2023-09-02 ENCOUNTER — Encounter: Payer: Self-pay | Admitting: Internal Medicine

## 2023-09-02 ENCOUNTER — Other Ambulatory Visit (HOSPITAL_COMMUNITY)
Admission: RE | Admit: 2023-09-02 | Discharge: 2023-09-02 | Disposition: A | Discharge status: Home / Self Care | Source: Ambulatory Visit | Attending: Internal Medicine | Admitting: Internal Medicine

## 2023-09-02 ENCOUNTER — Ambulatory Visit: Attending: Internal Medicine | Admitting: Internal Medicine

## 2023-09-02 VITALS — BP 100/64 | HR 87 | Temp 98.2°F | Ht 64.0 in | Wt 172.0 lb

## 2023-09-02 DIAGNOSIS — Z124 Encounter for screening for malignant neoplasm of cervix: Secondary | ICD-10-CM | POA: Diagnosis not present

## 2023-09-02 DIAGNOSIS — F411 Generalized anxiety disorder: Secondary | ICD-10-CM | POA: Diagnosis not present

## 2023-09-02 NOTE — Progress Notes (Signed)
 Patient ID: Kara Atkinson, female    DOB: August 25, 1980  MRN: [id number]  CC: Gynecologic Exam (Pap. /No questions / concerns./Yes to shingles vax)   Subjective: Kara Atkinson is a 43 y.o. female who presents for PAP Her concerns today include:  Patient with history of CKD 3a, G6PD deficiency, pulmonary sarcoidosis, former smoker, HL, cystine kidney stones   GYN History:  Pt is G3P2 (1 miscarriage) Any hx of abn paps?: years ago, does not recall details Menses regular or irregular?: Regular How long does menses last? 3-4 days Menstrual flow light or heavy?: light  Method of birth control?:  no Any vaginal dischg at this time?: no Dysuria?:  no Any hx of STI?: no Sexually active with how many partners: husband only  Desires STI screen: no Last MMG: 07/2023 Family hx of uterine, cervical or breast cancer?:  no  GAD: prescribed Zoloft  and Hydroxyzine  on last visit 2 mths ago.  She has not started them.  Reports she is maintaining ok for now.  May decide to fill the prescription for the hydroxyzine  to keep on hand and use if needed.  Patient Active Problem List   Diagnosis Date Noted   G6PD deficiency 02/28/2022   Cystine stones (HCC) 06/05/2021   Stage 3a chronic kidney disease (HCC) 06/05/2021   Anemia 05/11/2019   Low mean corpuscular volume (MCV) 12/24/2017   Leukopenia 12/24/2017   PULMONARY SARCOIDOSIS 09/06/2009     Current Outpatient Medications on File Prior to Visit  Medication Sig Dispense Refill   atorvastatin  (LIPITOR) 20 MG tablet Take 1 tablet (20 mg total) by mouth daily. 90 tablet 3   busPIRone (BUSPAR) 10 MG tablet Take 10 mg by mouth 2 (two) times daily.     hydrOXYzine  (VISTARIL ) 25 MG capsule Take 1 capsule (25 mg total) by mouth daily as needed (can cause drowsiness). 30 capsule 0   sertraline  (ZOLOFT ) 50 MG tablet 1/2 tab PO daily x 2 weeks then 1 tab daily there after (Patient not taking: Reported on 09/02/2023) 60 tablet 1   No current  facility-administered medications on file prior to visit.    Allergies  Allergen Reactions   Sulfonamide Derivatives Other (See Comments)    Patient denies allergy    Social History   Socioeconomic History   Marital status: Married    Spouse name: Not on file   Number of children: Not on file   Years of education: Not on file   Highest education level: Not on file  Occupational History   Occupation: CNA  Tobacco Use   Smoking status: Former    Current packs/day: 0.00    Average packs/day: 0.5 packs/day for 10.0 years (5.0 ttl pk-yrs)    Types: Cigarettes    Start date: 10/17/1998    Quit date: 10/16/2008    Years since quitting: 14.8   Smokeless tobacco: Never  Vaping Use   Vaping status: Never Used  Substance and Sexual Activity   Alcohol use: No   Drug use: No   Sexual activity: Yes  Other Topics Concern   Not on file  Social History Narrative   Not on file   Social Drivers of Health   Financial Resource Strain: Low Risk  (09/02/2023)   Overall Financial Resource Strain (CARDIA)    Difficulty of Paying Living Expenses: Not hard at all  Food Insecurity: No Food Insecurity (09/02/2023)   Hunger Vital Sign    Worried About Running Out of Food in the Last Year: Never true  Ran Out of Food in the Last Year: Never true  Transportation Needs: No Transportation Needs (09/02/2023)   PRAPARE - Administrator, Civil Service (Medical): No    Lack of Transportation (Non-Medical): No  Physical Activity: Sufficiently Active (09/02/2023)   Exercise Vital Sign    Days of Exercise per Week: 5 days    Minutes of Exercise per Session: 90 min  Stress: No Stress Concern Present (09/02/2023)   [employer] of Occupational Health - Occupational Stress Questionnaire    Feeling of Stress: Not at all  Social Connections: Socially Isolated (09/02/2023)   Social Connection and Isolation Panel    Frequency of Communication with Friends and Family: Once a week    Frequency of  Social Gatherings with Friends and Family: Once a week    Attends Religious Services: Never    Database administrator or Organizations: No    Attends Banker Meetings: Never    Marital Status: Married  Catering manager Violence: Not At Risk (09/02/2023)   Humiliation, Afraid, Rape, and Kick questionnaire    Fear of Current or Ex-Partner: No    Emotionally Abused: No    Physically Abused: No    Sexually Abused: No    Family History  Problem Relation Age of Onset   Sarcoidosis Other        cousin    Past Surgical History:  Procedure Laterality Date   cystine stone removal     CYSTOSCOPY N/A 02/17/2019   Procedure: CYSTOSCOPY FLEXIBLE;  Surgeon: Andrez Banker, MD;  Location: [facility];  Service: Urology;  Laterality: N/A;   HOLMIUM LASER APPLICATION Right 02/17/2019   Procedure: HOLMIUM LASER APPLICATION;  Surgeon: Andrez Banker, MD;  Location: [facility];  Service: Urology;  Laterality: Right;   NEPHROLITHOTOMY Right 02/17/2019   Procedure: NEPHROLITHOTOMY PERCUTANEOUS WITH SURGEON ACCESS;  Surgeon: Andrez Banker, MD;  Location: [facility];  Service: Urology;  Laterality: Right;    ROS: Review of Systems Negative except as stated above  PHYSICAL EXAM: BP 100/64 (BP Location: Left Arm, Patient Position: Sitting, Cuff Size: Normal)   Pulse 87   Temp 98.2 F (36.8 C) (Oral)   Ht 5' 4 (1.626 m)   Wt 172 lb (78 kg)   BMI 29.52 kg/m   Physical Exam  General appearance - alert, well appearing, and in no distress Mental status - normal mood, behavior, speech, dress, motor activity, and thought processes Pelvic - CMA Clarisa is present: normal external genitalia, vulva, vagina, cervix, uterus and adnexa      Latest Ref Rng & Units 07/01/2023    3:42 PM 02/28/2022   10:01 AM 02/29/2020    9:46 AM  CMP  Glucose 70 - 99 mg/dL 89  85  74   BUN 6 - 24 mg/dL 16  14  16    Creatinine 0.57 - 1.00 mg/dL 1.30  8.65  7.84   Sodium 134 - 144 mmol/L 139  138  135    Potassium 3.5 - 5.2 mmol/L 4.8  4.1  4.2   Chloride 96 - 106 mmol/L 101  100  98   CO2 20 - 29 mmol/L 26  20  22    Calcium  8.7 - 10.2 mg/dL 69.6  29.5  9.8   Total Protein 6.0 - 8.5 g/dL 7.6  8.2  8.0   Total Bilirubin 0.0 - 1.2 mg/dL 1.0  1.4  1.0   Alkaline Phos 44 - 121 IU/L 50  48  53   AST 0 - 40 IU/L 19  24  18    ALT 0 - 32 IU/L 10  13  11     Lipid Panel     Component Value Date/Time   CHOL 255 (H) 07/01/2023 1542   TRIG 57 07/01/2023 1542   HDL 91 07/01/2023 1542   CHOLHDL 2.8 07/01/2023 1542   LDLCALC 155 (H) 07/01/2023 1542    CBC    Component Value Date/Time   WBC 4.1 07/01/2023 1542   WBC 10.7 (H) 02/19/2019 0545   RBC 5.39 (H) 07/01/2023 1542   RBC 2.97 (L) 02/19/2019 0545   HGB 12.9 07/01/2023 1542   HCT 42.4 07/01/2023 1542   PLT 188 07/01/2023 1542   MCV 79 07/01/2023 1542   MCH 23.9 (L) 07/01/2023 1542   MCH 26.9 02/19/2019 0545   MCHC 30.4 (L) 07/01/2023 1542   MCHC 31.9 02/19/2019 0545   RDW 13.1 07/01/2023 1542   LYMPHSABS 1.2 02/28/2022 1001   MONOABS 0.4 03/03/2018 0922   EOSABS 0.1 02/28/2022 1001   BASOSABS 0.0 02/28/2022 1001    ASSESSMENT AND PLAN: 1. Pap smear for cervical cancer screening (Primary) - Cytology - PAP  2. GAD (generalized anxiety disorder) Zoloft  and hydroxyzine  prescribed on last visit.  However patient has not filled them as yet.  Reports that so far she is maintaining okay but may fill prescription for the hydroxyzine  to keep on hand to use if needed.    Patient was given the opportunity to ask questions.  Patient verbalized understanding of the plan and was able to repeat key elements of the plan.   This documentation was completed using Paediatric nurse.  Any transcriptional errors are unintentional.  No orders of the defined types were placed in this encounter.    Requested Prescriptions    No prescriptions requested or ordered in this encounter    Return in about 7 months (around  04/03/2024).  Concetta Dee, MD, FACP

## 2023-09-04 LAB — CYTOLOGY - PAP
Comment: NEGATIVE
High risk HPV: NEGATIVE

## 2023-09-06 ENCOUNTER — Ambulatory Visit: Payer: Self-pay | Admitting: Internal Medicine

## 2023-09-11 ENCOUNTER — Telehealth: Payer: Self-pay

## 2023-09-11 NOTE — Telephone Encounter (Signed)
 Called pt to r/s. She said she was doing great after inj. She will cb to r/s if she needs to

## 2023-09-16 ENCOUNTER — Ambulatory Visit: Admitting: Orthopedic Surgery

## 2023-09-25 ENCOUNTER — Other Ambulatory Visit: Payer: Self-pay | Admitting: Internal Medicine

## 2023-09-25 DIAGNOSIS — F411 Generalized anxiety disorder: Secondary | ICD-10-CM

## 2023-11-24 ENCOUNTER — Encounter: Payer: Self-pay | Admitting: Internal Medicine

## 2023-11-26 ENCOUNTER — Ambulatory Visit: Attending: Internal Medicine

## 2023-11-26 ENCOUNTER — Other Ambulatory Visit: Payer: Self-pay | Admitting: Internal Medicine

## 2023-11-26 DIAGNOSIS — N289 Disorder of kidney and ureter, unspecified: Secondary | ICD-10-CM

## 2023-11-27 ENCOUNTER — Ambulatory Visit: Payer: Self-pay | Admitting: Internal Medicine

## 2023-11-27 LAB — BASIC METABOLIC PANEL WITH GFR
BUN/Creatinine Ratio: 16 (ref 9–23)
BUN: 20 mg/dL (ref 6–24)
CO2: 23 mmol/L (ref 20–29)
Calcium: 10 mg/dL (ref 8.7–10.2)
Chloride: 99 mmol/L (ref 96–106)
Creatinine, Ser: 1.29 mg/dL — ABNORMAL HIGH (ref 0.57–1.00)
Glucose: 87 mg/dL (ref 70–99)
Potassium: 4.2 mmol/L (ref 3.5–5.2)
Sodium: 138 mmol/L (ref 134–144)
eGFR: 53 mL/min/1.73 — ABNORMAL LOW (ref >59)

## 2024-01-02 ENCOUNTER — Encounter: Payer: Self-pay | Admitting: Family Medicine

## 2024-01-02 ENCOUNTER — Ambulatory Visit (HOSPITAL_BASED_OUTPATIENT_CLINIC_OR_DEPARTMENT_OTHER): Admitting: Family Medicine

## 2024-01-02 ENCOUNTER — Ambulatory Visit: Admitting: Family Medicine

## 2024-01-02 DIAGNOSIS — Z130 Encounter for screening for diseases of the blood and blood-forming organs and certain disorders involving the immune mechanism: Secondary | ICD-10-CM

## 2024-01-02 DIAGNOSIS — R42 Dizziness and giddiness: Secondary | ICD-10-CM

## 2024-01-02 NOTE — Progress Notes (Signed)
 Virtual Visit via Video Note  I connected with Kara Atkinson, on 01/02/2024 at 3:57 PM by video enabled telemedicine device and verified that I am speaking with the correct person using two identifiers.   Consent: I discussed the limitations, risks, security and privacy concerns of performing an evaluation and management service by telemedicine and the availability of in person appointments. I also discussed with the patient that there may be a patient responsible charge related to this service. The patient expressed understanding and agreed to proceed.   Location of Patient: Out and about  Location of Provider: Clinic   Persons participating in Telemedicine visit: Kara Atkinson Dr. Delbert    Discussed the use of AI scribe software for clinical note transcription with the patient, who gave verbal consent to proceed.  History of Present Illness Kara Atkinson is a 43 year old female patient of Dr. Vicci with a history of GAD who presents with dizziness.  Dizziness initially occurred once, resolved, but recurred last week and is ongoing. It has slightly improved but persists, especially when lying down, rolling over in bed, or tilting her head backward while standing. No dizziness occurs while standing or moving otherwise. The sensation is described as the room spinning.  Blood pressure is usually in the low nineties when lying down, with the lowest recorded at 88/50, and appears to increase upon standing.  No ear fullness, congestion, facial pressure, headaches, nausea, or vomiting.      Past Medical History:  Diagnosis Date   Glucose-6-phosphate dehydrogenase deficiency    Kidney stone    Pulmonary sarcoidosis    Allergies  Allergen Reactions   Sulfonamide Derivatives Other (See Comments)    Patient denies allergy    Current Outpatient Medications on File Prior to Visit  Medication Sig Dispense Refill   atorvastatin  (LIPITOR) 20 MG tablet Take 1 tablet (20 mg  total) by mouth daily. 90 tablet 3   busPIRone (BUSPAR) 10 MG tablet Take 10 mg by mouth 2 (two) times daily.     hydrOXYzine  (VISTARIL ) 25 MG capsule Take 1 capsule (25 mg total) by mouth daily as needed (can cause drowsiness). F41.1 90 capsule 1   sertraline  (ZOLOFT ) 50 MG tablet Take 1 tablet (50 mg total) by mouth daily. F41.1 180 tablet 1   No current facility-administered medications on file prior to visit.    ROS: See HPI  Observations/Objective: Awake, alert, oriented x3 Not in acute distress Normal mood      Latest Ref Rng & Units 11/26/2023   10:20 AM 07/01/2023    3:42 PM 02/28/2022   10:01 AM  CMP  Glucose 70 - 99 mg/dL 87  89  85   BUN 6 - 24 mg/dL 20  16  14    Creatinine 0.57 - 1.00 mg/dL 8.70  8.69  8.71   Sodium 134 - 144 mmol/L 138  139  138   Potassium 3.5 - 5.2 mmol/L 4.2  4.8  4.1   Chloride 96 - 106 mmol/L 99  101  100   CO2 20 - 29 mmol/L 23  26  20    Calcium  8.7 - 10.2 mg/dL 89.9  89.7  89.9   Total Protein 6.0 - 8.5 g/dL  7.6  8.2   Total Bilirubin 0.0 - 1.2 mg/dL  1.0  1.4   Alkaline Phos 44 - 121 IU/L  50  48   AST 0 - 40 IU/L  19  24   ALT 0 - 32 IU/L  10  13     Lipid Panel     Component Value Date/Time   CHOL 255 (H) 07/01/2023 1542   TRIG 57 07/01/2023 1542   HDL 91 07/01/2023 1542   CHOLHDL 2.8 07/01/2023 1542   LDLCALC 155 (H) 07/01/2023 1542   LABVLDL 9 07/01/2023 1542    No results found for: HGBA1C   Assessment and plan:   Assessment & Plan Vertigo Intermittent dizziness with room spinning sensation, likely due to displaced otoliths. Possible orthostatic hypotension indicated by low blood pressure readings when lying down. - Refer to vestibular rehabilitation for otolith repositioning maneuvers. - Advise PCP appointment for orthostatic hypotension evaluation. - Recommend compression stockings and slow position changes.  Screening for anemia Dizziness could be related to anemia. Previous hemoglobin was 12.9 six months  ago. - Order complete blood count. - Schedule blood draw for Tuesday, January 06, 2024, at 8:30 AM. - Communicate results via MyChart.     No orders of the defined types were placed in this encounter.   Follow Up Instructions: Schedule appointment with PCP in 1 months or assess blood pressure and evaluate for presence of orthostatic hypotension   I discussed the assessment and treatment plan with the patient. The patient was provided an opportunity to ask questions and all were answered. The patient agreed with the plan and demonstrated an understanding of the instructions.   The patient was advised to call back or seek an in-person evaluation if the symptoms worsen or if the condition fails to improve as anticipated.     I provided 13 minutes total of Telehealth time during this encounter including median intraservice time, reviewing previous notes, investigations, ordering medications, medical decision making, coordinating care and patient verbalized understanding at the end of the visit.     Corrina Sabin, MD, FAAFP. [facility] [place], [place] [phone]   01/02/2024, 3:57 PM

## 2024-01-02 NOTE — Patient Instructions (Signed)
 Vertigo Vertigo is the feeling that you or the things around you are moving or spinning when they're not. It's different than feeling dizzy. It can also cause: Loss of balance. Trouble standing or walking. Nausea and vomiting. This feeling can come and go at any time. It can last from a few seconds to minutes or even hours. It may go away on its own or be treated with medicine. What are the types of vertigo? There are two types of vertigo: Peripheral vertigo happens when parts of your inner ear don't work like they should. This is the more common type. Central vertigo happens when your brain and spinal cord don't work like they should. Your health care provider will do tests to find out what kind of vertigo you have. This will help them decide on the right treatment for you. Follow these instructions at home: Eating and drinking Drink enough fluid to keep your pee (urine) pale yellow. Do not drink alcohol. Activity When you get up in the morning, first sit up on the side of the bed. When you feel okay, stand slowly while holding onto something. Move slowly. Avoid sudden body or head movements. Avoid certain positions, as told by your provider. Use a cane if you have trouble standing or walking. Sit down right away if you feel unsteady. Place items in your home so they're easy for you to reach without bending or leaning over. Return to normal activities when you're told. Ask what things are safe for you to do. General instructions Take your medicines only as told by your provider. Contact a health care provider if: Your medicines don't help or make your vertigo worse. You get new symptoms. You have a fever. You have nausea or vomiting. Your family or friends spot any changes in how you're acting. A part of your body goes numb. You feel tingling and prickling in a part of your body. You get very bad headaches. Get help right away if: You're always dizzy or you faint. You have a  stiff neck. You have trouble moving or speaking. Your hands, arms, or legs feel weak. Your hearing or eyesight changes. These symptoms may be an emergency. Call 911 right away. Do not wait to see if the symptoms will go away. Do not drive yourself to the hospital. This information is not intended to replace advice given to you by your health care provider. Make sure you discuss any questions you have with your health care provider. Document Revised: 12/05/2022 Document Reviewed: 06/07/2022 Elsevier Patient Education  2024 [employer].

## 2024-01-06 ENCOUNTER — Ambulatory Visit: Attending: Internal Medicine

## 2024-01-06 DIAGNOSIS — Z130 Encounter for screening for diseases of the blood and blood-forming organs and certain disorders involving the immune mechanism: Secondary | ICD-10-CM

## 2024-01-07 ENCOUNTER — Ambulatory Visit: Payer: Self-pay | Admitting: Family Medicine

## 2024-01-07 LAB — CBC WITH DIFFERENTIAL/PLATELET
Basophils Absolute: 0 x10E3/uL (ref 0.0–0.2)
Basos: 1 %
EOS (ABSOLUTE): 0.1 x10E3/uL (ref 0.0–0.4)
Eos: 2 %
Hematocrit: 41.9 % (ref 34.0–46.6)
Hemoglobin: 12.6 g/dL (ref 11.1–15.9)
Immature Grans (Abs): 0 x10E3/uL (ref 0.0–0.1)
Immature Granulocytes: 0 %
Lymphocytes Absolute: 1.2 x10E3/uL (ref 0.7–3.1)
Lymphs: 28 %
MCH: 24 pg — ABNORMAL LOW (ref 26.6–33.0)
MCHC: 30.1 g/dL — ABNORMAL LOW (ref 31.5–35.7)
MCV: 80 fL (ref 79–97)
Monocytes Absolute: 0.3 x10E3/uL (ref 0.1–0.9)
Monocytes: 8 %
Neutrophils Absolute: 2.5 x10E3/uL (ref 1.4–7.0)
Neutrophils: 61 %
Platelets: 166 x10E3/uL (ref 150–450)
RBC: 5.25 x10E6/uL (ref 3.77–5.28)
RDW: 13.6 % (ref 11.7–15.4)
WBC: 4.1 x10E3/uL (ref 3.4–10.8)

## 2024-01-19 ENCOUNTER — Encounter: Payer: Self-pay | Admitting: Radiology

## 2024-01-26 NOTE — Therapy (Signed)
 OUTPATIENT PHYSICAL THERAPY VESTIBULAR EVALUATION     Patient Name: Kara Atkinson MRN: [id number] DOB:06/25/1980, 43 y.o., female Today's Date: 01/27/2024  END OF SESSION:  PT End of Session - 01/27/24 1458     Visit Number 1    Authorization Type [employer]    PT Start Time 1500    PT Stop Time 1545    PT Time Calculation (min) 45 min          Past Medical History:  Diagnosis Date   Glucose-6-phosphate dehydrogenase deficiency    Kidney stone    Pulmonary sarcoidosis    Past Surgical History:  Procedure Laterality Date   cystine stone removal     CYSTOSCOPY N/A 02/17/2019   Procedure: CYSTOSCOPY FLEXIBLE;  Surgeon: Cam Morene ORN, MD;  Location: [facility];  Service: Urology;  Laterality: N/A;   HOLMIUM LASER APPLICATION Right 02/17/2019   Procedure: HOLMIUM LASER APPLICATION;  Surgeon: Cam Morene ORN, MD;  Location: [facility];  Service: Urology;  Laterality: Right;   NEPHROLITHOTOMY Right 02/17/2019   Procedure: NEPHROLITHOTOMY PERCUTANEOUS WITH SURGEON ACCESS;  Surgeon: Cam Morene ORN, MD;  Location: [facility];  Service: Urology;  Laterality: Right;   Patient Active Problem List   Diagnosis Date Noted   G6PD deficiency 02/28/2022   Cystine stones 06/05/2021   Stage 3a chronic kidney disease (HCC) 06/05/2021   Anemia 05/11/2019   Low mean corpuscular volume (MCV) 12/24/2017   Leukopenia 12/24/2017   PULMONARY SARCOIDOSIS 09/06/2009    PCP: Barnie Louder REFERRING PROVIDER: Delbert Clam  REFERRING DIAG:  R42 (ICD-10-CM) - Vertigo    THERAPY DIAG:  BPPV (benign paroxysmal positional vertigo), left  Dizziness and giddiness  ONSET DATE: 12/26/23  Rationale for Evaluation and Treatment: Rehabilitation  SUBJECTIVE:   SUBJECTIVE STATEMENT: I am usually fine with daily activities. When I tilt my head back and roll over I feel the dizziness. It is rolling to both sides.   Pt accompanied by: self  PERTINENT HISTORY:  Kara Atkinson is a 43 year old  female patient of Dr. Louder with a history of GAD who presents with dizziness.   Dizziness initially occurred once, resolved, but recurred last week and is ongoing. It has slightly improved but persists, especially when lying down, rolling over in bed, or tilting her head backward while standing. No dizziness occurs while standing or moving otherwise. The sensation is described as the room spinning.   Blood pressure is usually in the low nineties when lying down, with the lowest recorded at 88/50, and appears to increase upon standing.  PAIN:  Are you having pain? No, well my back but I pulled a muscle while exercising   PRECAUTIONS: None  RED FLAGS: None   WEIGHT BEARING RESTRICTIONS: No  FALLS: Has patient fallen in last 6 months? No  LIVING ENVIRONMENT: Lives with: lives with their family Lives in: House/apartment  PLOF: Independent  PATIENT GOALS: to not be order   OBJECTIVE:  Note: Objective measures were completed at Evaluation unless otherwise noted.  DIAGNOSTIC FINDINGS: N/A  COGNITION: Overall cognitive status: Within functional limits for tasks assessed   SENSATION: WFL   POSTURE:  No Significant postural limitations  Cervical ROM:    Active A/PROM (deg) eval  Flexion WNL  Extension WFL, but dizzy  Right lateral flexion 75% woozy  Left lateral flexion 75% woozy  Right rotation WNL  Left rotation WNL  (Blank rows = not tested)   LOWER EXTREMITY MMT: 5/5  GAIT: Gait pattern: WFL Distance walked: in  clinic distances Assistive device utilized: None Level of assistance: Complete Independence  VESTIBULAR ASSESSMENT:    SYMPTOM BEHAVIOR:  Subjective history  Non-Vestibular symptoms: None  Type of dizziness: Spinning/Vertigo and Funny feeling in the head  Frequency: pretty much every day  Duration: a few seconds  Aggravating factors: Induced by position change: rolling to the right and rolling to the left and Induced by motion: looking up  at the ceiling  Relieving factors: head stationary, lying supine, rest, and slow movements  Progression of symptoms: better  OCULOMOTOR EXAM:  Ocular Alignment: normal  Ocular ROM: No Limitations  Spontaneous Nystagmus: absent  Gaze-Induced Nystagmus: absent  Smooth Pursuits: intact  Saccades: intact  VESTIBULAR - OCULAR REFLEX:   Slow VOR: Normal  VOR Cancellation: Normal   POSITIONAL TESTING: Right Dix-Hallpike: no nystagmus Left Dix-Hallpike: upbeating, left nystagmus  MOTION SENSITIVITY:  Motion Sensitivity Quotient Intensity: 0 = none, 1 = Lightheaded, 2 = Mild, 3 = Moderate, 4 = Severe, 5 = Vomiting  Intensity  1. Sitting to supine   2. Supine to L side   3. Supine to R side   4. Supine to sitting   5. L Hallpike-Dix   6. Up from L    7. R Hallpike-Dix   8. Up from R    9. Sitting, head tipped to L knee   10. Head up from L knee   11. Sitting, head tipped to R knee   12. Head up from R knee   13. Sitting head turns x5   14.Sitting head nods x5   15. In stance, 180 turn to L    16. In stance, 180 turn to R     OTHOSTATICS: not done                                                                                            TREATMENT DATE: 01/27/24   Canalith Repositioning:  Epley Left: Number of Reps: 2 and Response to Treatment: symptoms improved   PATIENT EDUCATION: Education details: POC, HEP, vestibular anatomy, BPPV Person educated: Patient Education method: Explanation and Demonstration Education comprehension: verbalized understanding and returned demonstration  HOME EXERCISE PROGRAM: Access Code: [email or web address] URL: [email or web address] Date: 01/27/2024 Prepared by: Almetta Fam  Exercises - Seated Gaze Stabilization with Head Nod  - 2 x daily - 7 x weekly - 2 sets - 10 reps - Seated Gaze Stabilization with Head Rotation  - 2 x daily - 7 x weekly - 2 sets - 10 reps - Seated VOR Cancellation  - 2 x daily - 7 x weekly - 2 sets - 10  reps  Brandt-Daroff exercises to do 1x day 3 reps as needed  GOALS: Goals reviewed with patient? Yes  SHORT TERM GOALS: Target date: 02/24/24  Patient will be independent with initial HEP. Baseline:  Goal status: INITIAL   LONG TERM GOALS: Target date: 03/23/24  Patient will be independent with advanced/ongoing HEP to improve outcomes and carryover.  Baseline:  Goal status: INITIAL  2.  Patient will demonstrate full cervical ROM without symptoms of dizziness Baseline:  Goal status: INITIAL  3.  Patient will  be able to tilt her head back and roll over in bed without dizziness Baseline:  Goal status: INITIAL   4.  Patient will tolerate high level VOR, balance, and multitask interventions without provoking vertigo symptoms  Baseline:  Goal status: INITIAL  ASSESSMENT:  CLINICAL IMPRESSION: Patient is a 43 y.o. female who was seen today for physical therapy evaluation and treatment for BPPV. She reports symptoms of dizziness when tilting her head back and when rolling over in bed. Her dizziness is intermittent with room spinning sensation, this likely due to displaced otoliths. We did some repositioning maneuvers today. With Kara Atkinson to the L she had some dizziness and up beating nystagmus. Epley maneuver was complete twice, with symptoms improving and lasting only a few seconds the second time. Patient was educated on BPPV and how maneuvers and VOR exercises will help resolve her symptoms. She will benefit from PT to work on vestibular rehab to eliminate her dizziness to be able to look up and roll in bed without symptom provocation.    OBJECTIVE IMPAIRMENTS: decreased balance, decreased ROM, and dizziness.   ACTIVITY LIMITATIONS: looking up and rolling  REHAB POTENTIAL: Good  CLINICAL DECISION MAKING: Stable/uncomplicated  EVALUATION COMPLEXITY: Low   PLAN:  PT FREQUENCY: 1x/week  PT DURATION: 8 weeks  PLANNED INTERVENTIONS: 97110-Therapeutic exercises, 97530-  Therapeutic activity, [id number]- Neuromuscular re-education, 97535- Self Care, [id number]- Manual therapy, [phone]- Canalith repositioning, 20560 (1-2 muscles), 20561 (3+ muscles)- Dry Needling, Patient/Family education, Joint mobilization, Spinal mobilization, Vestibular training, Cryotherapy, and Moist heat  PLAN FOR NEXT SESSION: recheck canals, how is Kara Atkinson and VOR exercises going   [employer], PT 01/27/2024, 3:52 PM

## 2024-01-27 ENCOUNTER — Ambulatory Visit: Attending: Family Medicine

## 2024-01-27 DIAGNOSIS — R42 Dizziness and giddiness: Secondary | ICD-10-CM | POA: Insufficient documentation

## 2024-01-27 DIAGNOSIS — H8112 Benign paroxysmal vertigo, left ear: Secondary | ICD-10-CM | POA: Diagnosis present

## 2024-02-03 ENCOUNTER — Ambulatory Visit

## 2024-02-03 NOTE — Therapy (Incomplete)
 OUTPATIENT PHYSICAL THERAPY VESTIBULAR TREATMENT     Patient Name: Kara Atkinson MRN: [id number] DOB:02-Mar-1981, 43 y.o., female Today's Date: 02/03/2024  END OF SESSION:    Past Medical History:  Diagnosis Date   Glucose-6-phosphate dehydrogenase deficiency    Kidney stone    Pulmonary sarcoidosis    Past Surgical History:  Procedure Laterality Date   cystine stone removal     CYSTOSCOPY N/A 02/17/2019   Procedure: CYSTOSCOPY FLEXIBLE;  Surgeon: Cam Morene ORN, MD;  Location: [facility];  Service: Urology;  Laterality: N/A;   HOLMIUM LASER APPLICATION Right 02/17/2019   Procedure: HOLMIUM LASER APPLICATION;  Surgeon: Cam Morene ORN, MD;  Location: [facility];  Service: Urology;  Laterality: Right;   NEPHROLITHOTOMY Right 02/17/2019   Procedure: NEPHROLITHOTOMY PERCUTANEOUS WITH SURGEON ACCESS;  Surgeon: Cam Morene ORN, MD;  Location: [facility];  Service: Urology;  Laterality: Right;   Patient Active Problem List   Diagnosis Date Noted   G6PD deficiency 02/28/2022   Cystine stones 06/05/2021   Stage 3a chronic kidney disease (HCC) 06/05/2021   Anemia 05/11/2019   Low mean corpuscular volume (MCV) 12/24/2017   Leukopenia 12/24/2017   PULMONARY SARCOIDOSIS 09/06/2009    PCP: Barnie Louder REFERRING PROVIDER: Delbert Clam  REFERRING DIAG:  R42 (ICD-10-CM) - Vertigo    THERAPY DIAG:  No diagnosis found.  ONSET DATE: 12/26/23  Rationale for Evaluation and Treatment: Rehabilitation  SUBJECTIVE:   SUBJECTIVE STATEMENT: I am usually fine with daily activities. When I tilt my head back and roll over I feel the dizziness. It is rolling to both sides.   Pt accompanied by: self  PERTINENT HISTORY:  Kara Atkinson is a 43 year old female patient of Dr. Louder with a history of GAD who presents with dizziness.   Dizziness initially occurred once, resolved, but recurred last week and is ongoing. It has slightly improved but persists, especially when lying down,  rolling over in bed, or tilting her head backward while standing. No dizziness occurs while standing or moving otherwise. The sensation is described as the room spinning.   Blood pressure is usually in the low nineties when lying down, with the lowest recorded at 88/50, and appears to increase upon standing.  PAIN:  Are you having pain? No, well my back but I pulled a muscle while exercising   PRECAUTIONS: None  RED FLAGS: None   WEIGHT BEARING RESTRICTIONS: No  FALLS: Has patient fallen in last 6 months? No  LIVING ENVIRONMENT: Lives with: lives with their family Lives in: House/apartment  PLOF: Independent  PATIENT GOALS: to not be order   OBJECTIVE:  Note: Objective measures were completed at Evaluation unless otherwise noted.  DIAGNOSTIC FINDINGS: N/A  COGNITION: Overall cognitive status: Within functional limits for tasks assessed   SENSATION: WFL   POSTURE:  No Significant postural limitations  Cervical ROM:    Active A/PROM (deg) eval  Flexion WNL  Extension WFL, but dizzy  Right lateral flexion 75% woozy  Left lateral flexion 75% woozy  Right rotation WNL  Left rotation WNL  (Blank rows = not tested)   LOWER EXTREMITY MMT: 5/5  GAIT: Gait pattern: WFL Distance walked: in clinic distances Assistive device utilized: None Level of assistance: Complete Independence  VESTIBULAR ASSESSMENT:    SYMPTOM BEHAVIOR:  Subjective history  Non-Vestibular symptoms: None  Type of dizziness: Spinning/Vertigo and Funny feeling in the head  Frequency: pretty much every day  Duration: a few seconds  Aggravating factors: Induced by position change: rolling to  the right and rolling to the left and Induced by motion: looking up at the ceiling  Relieving factors: head stationary, lying supine, rest, and slow movements  Progression of symptoms: better  OCULOMOTOR EXAM:  Ocular Alignment: normal  Ocular ROM: No Limitations  Spontaneous Nystagmus:  absent  Gaze-Induced Nystagmus: absent  Smooth Pursuits: intact  Saccades: intact  VESTIBULAR - OCULAR REFLEX:   Slow VOR: Normal  VOR Cancellation: Normal   POSITIONAL TESTING: Right Dix-Hallpike: no nystagmus Left Dix-Hallpike: upbeating, left nystagmus  MOTION SENSITIVITY:  Motion Sensitivity Quotient Intensity: 0 = none, 1 = Lightheaded, 2 = Mild, 3 = Moderate, 4 = Severe, 5 = Vomiting  Intensity  1. Sitting to supine   2. Supine to L side   3. Supine to R side   4. Supine to sitting   5. L Hallpike-Dix   6. Up from L    7. R Hallpike-Dix   8. Up from R    9. Sitting, head tipped to L knee   10. Head up from L knee   11. Sitting, head tipped to R knee   12. Head up from R knee   13. Sitting head turns x5   14.Sitting head nods x5   15. In stance, 180 turn to L    16. In stance, 180 turn to R     OTHOSTATICS: not done                                                                                            TREATMENT DATE:  02/03/24 Recheck canals Test horizontal canals  VOR x1 and x2  Standing with feet together, eyes closed  Walking with head turns    01/27/24   Canalith Repositioning:  Epley Left: Number of Reps: 2 and Response to Treatment: symptoms improved   PATIENT EDUCATION: Education details: POC, HEP, vestibular anatomy, BPPV Person educated: Patient Education method: Explanation and Demonstration Education comprehension: verbalized understanding and returned demonstration  HOME EXERCISE PROGRAM: Access Code: [email or web address] URL: [email or web address] Date: 01/27/2024 Prepared by: Almetta Fam  Exercises - Seated Gaze Stabilization with Head Nod  - 2 x daily - 7 x weekly - 2 sets - 10 reps - Seated Gaze Stabilization with Head Rotation  - 2 x daily - 7 x weekly - 2 sets - 10 reps - Seated VOR Cancellation  - 2 x daily - 7 x weekly - 2 sets - 10 reps  Brandt-Daroff exercises to do 1x day 3 reps as needed  GOALS: Goals  reviewed with patient? Yes  SHORT TERM GOALS: Target date: 02/24/24  Patient will be independent with initial HEP. Baseline:  Goal status: INITIAL   LONG TERM GOALS: Target date: 03/23/24  Patient will be independent with advanced/ongoing HEP to improve outcomes and carryover.  Baseline:  Goal status: INITIAL  2.  Patient will demonstrate full cervical ROM without symptoms of dizziness Baseline:  Goal status: INITIAL  3.  Patient will be able to tilt her head back and roll over in bed without dizziness Baseline:  Goal status: INITIAL   4.  Patient will tolerate high level VOR, balance,  and multitask interventions without provoking vertigo symptoms  Baseline:  Goal status: INITIAL  ASSESSMENT:  CLINICAL IMPRESSION: Patient is a 42 y.o. female who was seen today for physical therapy evaluation and treatment for BPPV. She reports symptoms of dizziness when tilting her head back and when rolling over in bed. Her dizziness is intermittent with room spinning sensation, this likely due to displaced otoliths. We did some repositioning maneuvers today. With Trenda Craze to the L she had some dizziness and up beating nystagmus. Epley maneuver was complete twice, with symptoms improving and lasting only a few seconds the second time. Patient was educated on BPPV and how maneuvers and VOR exercises will help resolve her symptoms. She will benefit from PT to work on vestibular rehab to eliminate her dizziness to be able to look up and roll in bed without symptom provocation.    OBJECTIVE IMPAIRMENTS: decreased balance, decreased ROM, and dizziness.   ACTIVITY LIMITATIONS: looking up and rolling  REHAB POTENTIAL: Good  CLINICAL DECISION MAKING: Stable/uncomplicated  EVALUATION COMPLEXITY: Low   PLAN:  PT FREQUENCY: 1x/week  PT DURATION: 8 weeks  PLANNED INTERVENTIONS: 97110-Therapeutic exercises, 97530- Therapeutic activity, [id number]- Neuromuscular re-education, 97535- Self Care, 02859-  Manual therapy, [phone]- Canalith repositioning, 20560 (1-2 muscles), 20561 (3+ muscles)- Dry Needling, Patient/Family education, Joint mobilization, Spinal mobilization, Vestibular training, Cryotherapy, and Moist heat  PLAN FOR NEXT SESSION: recheck canals, how is Wilhelmena Carrel and VOR exercises going   [employer], PT 02/03/2024, 9:47 AM

## 2024-02-09 ENCOUNTER — Ambulatory Visit

## 2024-02-18 ENCOUNTER — Ambulatory Visit: Attending: Family Medicine

## 2024-02-18 NOTE — Therapy (Deleted)
 OUTPATIENT PHYSICAL THERAPY VESTIBULAR TREATMENT     Patient Name: Kara Atkinson MRN: [id number] DOB:September 14, 1980, 43 y.o., female Today's Date: 02/18/2024  END OF SESSION:    Past Medical History:  Diagnosis Date   Glucose-6-phosphate dehydrogenase deficiency    Kidney stone    Pulmonary sarcoidosis    Past Surgical History:  Procedure Laterality Date   cystine stone removal     CYSTOSCOPY N/A 02/17/2019   Procedure: CYSTOSCOPY FLEXIBLE;  Surgeon: Cam Morene ORN, MD;  Location: [facility];  Service: Urology;  Laterality: N/A;   HOLMIUM LASER APPLICATION Right 02/17/2019   Procedure: HOLMIUM LASER APPLICATION;  Surgeon: Cam Morene ORN, MD;  Location: [facility];  Service: Urology;  Laterality: Right;   NEPHROLITHOTOMY Right 02/17/2019   Procedure: NEPHROLITHOTOMY PERCUTANEOUS WITH SURGEON ACCESS;  Surgeon: Cam Morene ORN, MD;  Location: [facility];  Service: Urology;  Laterality: Right;   Patient Active Problem List   Diagnosis Date Noted   G6PD deficiency 02/28/2022   Cystine stones 06/05/2021   Stage 3a chronic kidney disease (HCC) 06/05/2021   Anemia 05/11/2019   Low mean corpuscular volume (MCV) 12/24/2017   Leukopenia 12/24/2017   PULMONARY SARCOIDOSIS 09/06/2009    PCP: Barnie Louder REFERRING PROVIDER: Delbert Clam  REFERRING DIAG:  R42 (ICD-10-CM) - Vertigo    THERAPY DIAG:  No diagnosis found.  ONSET DATE: 12/26/23  Rationale for Evaluation and Treatment: Rehabilitation  SUBJECTIVE:   SUBJECTIVE STATEMENT: I am usually fine with daily activities. When I tilt my head back and roll over I feel the dizziness. It is rolling to both sides.   Pt accompanied by: self  PERTINENT HISTORY:  Kara Atkinson is a 43 year old female patient of Dr. Louder with a history of GAD who presents with dizziness.   Dizziness initially occurred once, resolved, but recurred last week and is ongoing. It has slightly improved but persists, especially when lying down,  rolling over in bed, or tilting her head backward while standing. No dizziness occurs while standing or moving otherwise. The sensation is described as the room spinning.   Blood pressure is usually in the low nineties when lying down, with the lowest recorded at 88/50, and appears to increase upon standing.  PAIN:  Are you having pain? No, well my back but I pulled a muscle while exercising   PRECAUTIONS: None  RED FLAGS: None   WEIGHT BEARING RESTRICTIONS: No  FALLS: Has patient fallen in last 6 months? No  LIVING ENVIRONMENT: Lives with: lives with their family Lives in: House/apartment  PLOF: Independent  PATIENT GOALS: to not be order   OBJECTIVE:  Note: Objective measures were completed at Evaluation unless otherwise noted.  DIAGNOSTIC FINDINGS: N/A  COGNITION: Overall cognitive status: Within functional limits for tasks assessed   SENSATION: WFL   POSTURE:  No Significant postural limitations  Cervical ROM:    Active A/PROM (deg) eval  Flexion WNL  Extension WFL, but dizzy  Right lateral flexion 75% woozy  Left lateral flexion 75% woozy  Right rotation WNL  Left rotation WNL  (Blank rows = not tested)   LOWER EXTREMITY MMT: 5/5  GAIT: Gait pattern: WFL Distance walked: in clinic distances Assistive device utilized: None Level of assistance: Complete Independence  VESTIBULAR ASSESSMENT:    SYMPTOM BEHAVIOR:  Subjective history  Non-Vestibular symptoms: None  Type of dizziness: Spinning/Vertigo and Funny feeling in the head  Frequency: pretty much every day  Duration: a few seconds  Aggravating factors: Induced by position change: rolling to  the right and rolling to the left and Induced by motion: looking up at the ceiling  Relieving factors: head stationary, lying supine, rest, and slow movements  Progression of symptoms: better  OCULOMOTOR EXAM:  Ocular Alignment: normal  Ocular ROM: No Limitations  Spontaneous Nystagmus:  absent  Gaze-Induced Nystagmus: absent  Smooth Pursuits: intact  Saccades: intact  VESTIBULAR - OCULAR REFLEX:   Slow VOR: Normal  VOR Cancellation: Normal   POSITIONAL TESTING: Right Dix-Hallpike: no nystagmus Left Dix-Hallpike: upbeating, left nystagmus  MOTION SENSITIVITY:  Motion Sensitivity Quotient Intensity: 0 = none, 1 = Lightheaded, 2 = Mild, 3 = Moderate, 4 = Severe, 5 = Vomiting  Intensity  1. Sitting to supine   2. Supine to L side   3. Supine to R side   4. Supine to sitting   5. L Hallpike-Dix   6. Up from L    7. R Hallpike-Dix   8. Up from R    9. Sitting, head tipped to L knee   10. Head up from L knee   11. Sitting, head tipped to R knee   12. Head up from R knee   13. Sitting head turns x5   14.Sitting head nods x5   15. In stance, 180 turn to L    16. In stance, 180 turn to R     OTHOSTATICS: not done                                                                                            TREATMENT DATE:  02/03/24 Recheck canals Test horizontal canals  VOR x1 and x2  Standing with feet together, eyes closed  Walking with head turns    01/27/24   Canalith Repositioning:  Epley Left: Number of Reps: 2 and Response to Treatment: symptoms improved   PATIENT EDUCATION: Education details: POC, HEP, vestibular anatomy, BPPV Person educated: Patient Education method: Explanation and Demonstration Education comprehension: verbalized understanding and returned demonstration  HOME EXERCISE PROGRAM: Access Code: [email or web address] URL: [email or web address] Date: 01/27/2024 Prepared by: Almetta Fam  Exercises - Seated Gaze Stabilization with Head Nod  - 2 x daily - 7 x weekly - 2 sets - 10 reps - Seated Gaze Stabilization with Head Rotation  - 2 x daily - 7 x weekly - 2 sets - 10 reps - Seated VOR Cancellation  - 2 x daily - 7 x weekly - 2 sets - 10 reps  Brandt-Daroff exercises to do 1x day 3 reps as needed  GOALS: Goals  reviewed with patient? Yes  SHORT TERM GOALS: Target date: 02/24/24  Patient will be independent with initial HEP. Baseline:  Goal status: INITIAL   LONG TERM GOALS: Target date: 03/23/24  Patient will be independent with advanced/ongoing HEP to improve outcomes and carryover.  Baseline:  Goal status: INITIAL  2.  Patient will demonstrate full cervical ROM without symptoms of dizziness Baseline:  Goal status: INITIAL  3.  Patient will be able to tilt her head back and roll over in bed without dizziness Baseline:  Goal status: INITIAL   4.  Patient will tolerate high level VOR, balance,  and multitask interventions without provoking vertigo symptoms  Baseline:  Goal status: INITIAL  ASSESSMENT:  CLINICAL IMPRESSION: Patient is a 43 y.o. female who was seen today for physical therapy evaluation and treatment for BPPV. She reports symptoms of dizziness when tilting her head back and when rolling over in bed. Her dizziness is intermittent with room spinning sensation, this likely due to displaced otoliths. We did some repositioning maneuvers today. With Kara Atkinson to the L she had some dizziness and up beating nystagmus. Epley maneuver was complete twice, with symptoms improving and lasting only a few seconds the second time. Patient was educated on BPPV and how maneuvers and VOR exercises will help resolve her symptoms. She will benefit from PT to work on vestibular rehab to eliminate her dizziness to be able to look up and roll in bed without symptom provocation.    OBJECTIVE IMPAIRMENTS: decreased balance, decreased ROM, and dizziness.   ACTIVITY LIMITATIONS: looking up and rolling  REHAB POTENTIAL: Good  CLINICAL DECISION MAKING: Stable/uncomplicated  EVALUATION COMPLEXITY: Low   PLAN:  PT FREQUENCY: 1x/week  PT DURATION: 8 weeks  PLANNED INTERVENTIONS: 97110-Therapeutic exercises, 97530- Therapeutic activity, [id number]- Neuromuscular re-education, 97535- Self Care, [id number]-  Manual therapy, [phone]- Canalith repositioning, 20560 (1-2 muscles), 20561 (3+ muscles)- Dry Needling, Patient/Family education, Joint mobilization, Spinal mobilization, Vestibular training, Cryotherapy, and Moist heat  PLAN FOR NEXT SESSION: recheck canals, how is Kara Atkinson and VOR exercises going   Kara Atkinson L Grenda Lora, PT 02/18/2024, 3:16 PM

## 2024-02-23 ENCOUNTER — Ambulatory Visit

## 2024-02-23 NOTE — Therapy (Incomplete)
 OUTPATIENT PHYSICAL THERAPY VESTIBULAR TREATMENT     Patient Name: Kara Atkinson MRN: [id number] DOB:[birth year], 43 y.o., female Today's Date: 02/23/2024  END OF SESSION:    Past Medical History:  Diagnosis Date   Glucose-6-phosphate dehydrogenase deficiency    Kidney stone    Pulmonary sarcoidosis    Past Surgical History:  Procedure Laterality Date   cystine stone removal     CYSTOSCOPY N/A 02/17/2019   Procedure: CYSTOSCOPY FLEXIBLE;  Surgeon: Cam Morene ORN, MD;  Location: [facility];  Service: Urology;  Laterality: N/A;   HOLMIUM LASER APPLICATION Right 02/17/2019   Procedure: HOLMIUM LASER APPLICATION;  Surgeon: Cam Morene ORN, MD;  Location: [facility];  Service: Urology;  Laterality: Right;   NEPHROLITHOTOMY Right 02/17/2019   Procedure: NEPHROLITHOTOMY PERCUTANEOUS WITH SURGEON ACCESS;  Surgeon: Cam Morene ORN, MD;  Location: [facility];  Service: Urology;  Laterality: Right;   Patient Active Problem List   Diagnosis Date Noted   G6PD deficiency 02/28/2022   Cystine stones 06/05/2021   Stage 3a chronic kidney disease (HCC) 06/05/2021   Anemia 05/11/2019   Low mean corpuscular volume (MCV) 12/24/2017   Leukopenia 12/24/2017   PULMONARY SARCOIDOSIS 09/06/2009    PCP: Barnie Louder REFERRING PROVIDER: Delbert Clam  REFERRING DIAG:  R42 (ICD-10-CM) - Vertigo    THERAPY DIAG:  No diagnosis found.  ONSET DATE: 12/26/23  Rationale for Evaluation and Treatment: Rehabilitation  SUBJECTIVE:   SUBJECTIVE STATEMENT: I am usually fine with daily activities. When I tilt my head back and roll over I feel the dizziness. It is rolling to both sides.   Pt accompanied by: self  PERTINENT HISTORY:  Kara Atkinson is a 43 year old female patient of Dr. Louder with a history of GAD who presents with dizziness.   Dizziness initially occurred once, resolved, but recurred last week and is ongoing. It has slightly improved but persists, especially when lying down,  rolling over in bed, or tilting her head backward while standing. No dizziness occurs while standing or moving otherwise. The sensation is described as the room spinning.   Blood pressure is usually in the low nineties when lying down, with the lowest recorded at 88/50, and appears to increase upon standing.  PAIN:  Are you having pain? No, well my back but I pulled a muscle while exercising   PRECAUTIONS: None  RED FLAGS: None   WEIGHT BEARING RESTRICTIONS: No  FALLS: Has patient fallen in last 6 months? No  LIVING ENVIRONMENT: Lives with: lives with their family Lives in: House/apartment  PLOF: Independent  PATIENT GOALS: to not be order   OBJECTIVE:  Note: Objective measures were completed at Evaluation unless otherwise noted.  DIAGNOSTIC FINDINGS: N/A  COGNITION: Overall cognitive status: Within functional limits for tasks assessed   SENSATION: WFL   POSTURE:  No Significant postural limitations  Cervical ROM:    Active A/PROM (deg) eval  Flexion WNL  Extension WFL, but dizzy  Right lateral flexion 75% woozy  Left lateral flexion 75% woozy  Right rotation WNL  Left rotation WNL  (Blank rows = not tested)   LOWER EXTREMITY MMT: 5/5  GAIT: Gait pattern: WFL Distance walked: in clinic distances Assistive device utilized: None Level of assistance: Complete Independence  VESTIBULAR ASSESSMENT:    SYMPTOM BEHAVIOR:  Subjective history  Non-Vestibular symptoms: None  Type of dizziness: Spinning/Vertigo and Funny feeling in the head  Frequency: pretty much every day  Duration: a few seconds  Aggravating factors: Induced by position change: rolling to  the right and rolling to the left and Induced by motion: looking up at the ceiling  Relieving factors: head stationary, lying supine, rest, and slow movements  Progression of symptoms: better  OCULOMOTOR EXAM:  Ocular Alignment: normal  Ocular ROM: No Limitations  Spontaneous Nystagmus:  absent  Gaze-Induced Nystagmus: absent  Smooth Pursuits: intact  Saccades: intact  VESTIBULAR - OCULAR REFLEX:   Slow VOR: Normal  VOR Cancellation: Normal   POSITIONAL TESTING: Right Dix-Hallpike: no nystagmus Left Dix-Hallpike: upbeating, left nystagmus  MOTION SENSITIVITY:  Motion Sensitivity Quotient Intensity: 0 = none, 1 = Lightheaded, 2 = Mild, 3 = Moderate, 4 = Severe, 5 = Vomiting  Intensity  1. Sitting to supine   2. Supine to L side   3. Supine to R side   4. Supine to sitting   5. L Hallpike-Dix   6. Up from L    7. R Hallpike-Dix   8. Up from R    9. Sitting, head tipped to L knee   10. Head up from L knee   11. Sitting, head tipped to R knee   12. Head up from R knee   13. Sitting head turns x5   14.Sitting head nods x5   15. In stance, 180 turn to L    16. In stance, 180 turn to R     OTHOSTATICS: not done                                                                                            TREATMENT DATE:  02/23/23 Recheck canals maybe Recheck goals  VOR x1 VOR x2 Balance on airex -head turns and nods on  -VOR  -catch Walking with head turns and nods and then looking at a target  Walking on beam  Tandem walking   02/03/24 Recheck canals Test horizontal canals  VOR x1 and x2  Standing with feet together, eyes closed  Walking with head turns    01/27/24   Canalith Repositioning:  Epley Left: Number of Reps: 2 and Response to Treatment: symptoms improved   PATIENT EDUCATION: Education details: POC, HEP, vestibular anatomy, BPPV Person educated: Patient Education method: Explanation and Demonstration Education comprehension: verbalized understanding and returned demonstration  HOME EXERCISE PROGRAM: Access Code: [email or web address] URL: [email or web address] Date: 01/27/2024 Prepared by: Almetta Fam  Exercises - Seated Gaze Stabilization with Head Nod  - 2 x daily - 7 x weekly - 2 sets - 10 reps - Seated Gaze  Stabilization with Head Rotation  - 2 x daily - 7 x weekly - 2 sets - 10 reps - Seated VOR Cancellation  - 2 x daily - 7 x weekly - 2 sets - 10 reps  Brandt-Daroff exercises to do 1x day 3 reps as needed  GOALS: Goals reviewed with patient? Yes  SHORT TERM GOALS: Target date: 02/24/24  Patient will be independent with initial HEP. Baseline:  Goal status: INITIAL   LONG TERM GOALS: Target date: 03/23/24  Patient will be independent with advanced/ongoing HEP to improve outcomes and carryover.  Baseline:  Goal status: INITIAL  2.  Patient will demonstrate full cervical ROM  without symptoms of dizziness Baseline:  Goal status: INITIAL  3.  Patient will be able to tilt her head back and roll over in bed without dizziness Baseline:  Goal status: INITIAL   4.  Patient will tolerate high level VOR, balance, and multitask interventions without provoking vertigo symptoms  Baseline:  Goal status: INITIAL  ASSESSMENT:  CLINICAL IMPRESSION: Patient is a 43 y.o. female who was seen today for physical therapy evaluation and treatment for BPPV. She reports symptoms of dizziness when tilting her head back and when rolling over in bed. Her dizziness is intermittent with room spinning sensation, this likely due to displaced otoliths. We did some repositioning maneuvers today. With Trenda Craze to the L she had some dizziness and up beating nystagmus. Epley maneuver was complete twice, with symptoms improving and lasting only a few seconds the second time. Patient was educated on BPPV and how maneuvers and VOR exercises will help resolve her symptoms. She will benefit from PT to work on vestibular rehab to eliminate her dizziness to be able to look up and roll in bed without symptom provocation.    OBJECTIVE IMPAIRMENTS: decreased balance, decreased ROM, and dizziness.   ACTIVITY LIMITATIONS: looking up and rolling  REHAB POTENTIAL: Good  CLINICAL DECISION MAKING:  Stable/uncomplicated  EVALUATION COMPLEXITY: Low   PLAN:  PT FREQUENCY: 1x/week  PT DURATION: 8 weeks  PLANNED INTERVENTIONS: 97110-Therapeutic exercises, 97530- Therapeutic activity, [id number]- Neuromuscular re-education, 97535- Self Care, [id number]- Manual therapy, [phone]- Canalith repositioning, 20560 (1-2 muscles), 20561 (3+ muscles)- Dry Needling, Patient/Family education, Joint mobilization, Spinal mobilization, Vestibular training, Cryotherapy, and Moist heat  PLAN FOR NEXT SESSION: recheck canals, how is Wilhelmena Carrel and VOR exercises going   [employer], PT 02/23/2024, 2:01 PM

## 2024-03-01 ENCOUNTER — Encounter: Payer: Self-pay | Admitting: Internal Medicine

## 2024-03-16 ENCOUNTER — Ambulatory Visit: Payer: Self-pay | Admitting: Internal Medicine

## 2024-04-06 ENCOUNTER — Ambulatory Visit: Admitting: Internal Medicine

## 2024-09-02 ENCOUNTER — Ambulatory Visit: Admitting: Internal Medicine
